# Patient Record
Sex: Female | Born: 1965 | Race: White | Hispanic: No | Smoking: Current every day smoker
Health system: Southern US, Community
[De-identification: ages and names within clinical notes are randomized; demographics above are authoritative.]

## PROBLEM LIST (undated history)

## (undated) DIAGNOSIS — D649 Anemia, unspecified: Secondary | ICD-10-CM

## (undated) DIAGNOSIS — R112 Nausea with vomiting, unspecified: Secondary | ICD-10-CM

## (undated) DIAGNOSIS — R51 Headache: Secondary | ICD-10-CM

## (undated) DIAGNOSIS — R519 Headache, unspecified: Secondary | ICD-10-CM

## (undated) DIAGNOSIS — Z800 Family history of malignant neoplasm of digestive organs: Secondary | ICD-10-CM

## (undated) DIAGNOSIS — F32A Depression, unspecified: Secondary | ICD-10-CM

## (undated) DIAGNOSIS — F419 Anxiety disorder, unspecified: Secondary | ICD-10-CM

## (undated) DIAGNOSIS — F329 Major depressive disorder, single episode, unspecified: Secondary | ICD-10-CM

## (undated) DIAGNOSIS — Z9889 Other specified postprocedural states: Secondary | ICD-10-CM

## (undated) DIAGNOSIS — R87810 Cervical high risk human papillomavirus (HPV) DNA test positive: Secondary | ICD-10-CM

## (undated) HISTORY — DX: Cervical high risk human papillomavirus (HPV) DNA test positive: R87.810

## (undated) HISTORY — PX: COLONOSCOPY: SHX174

## (undated) HISTORY — PX: JOINT REPLACEMENT: SHX530

## (undated) HISTORY — DX: Family history of malignant neoplasm of digestive organs: Z80.0

---

## 2001-03-14 HISTORY — PX: ABDOMINAL HYSTERECTOMY: SHX81

## 2009-03-14 HISTORY — PX: CHOLECYSTECTOMY: SHX55

## 2012-09-28 ENCOUNTER — Ambulatory Visit: Payer: Self-pay | Admitting: Specialist

## 2012-09-28 DIAGNOSIS — Z01810 Encounter for preprocedural cardiovascular examination: Secondary | ICD-10-CM

## 2012-09-28 LAB — COMPREHENSIVE METABOLIC PANEL
Albumin: 3.1 g/dL — ABNORMAL LOW (ref 3.4–5.0)
Alkaline Phosphatase: 79 U/L (ref 50–136)
Anion Gap: 5 — ABNORMAL LOW (ref 7–16)
BUN: 8 mg/dL (ref 7–18)
Co2: 26 mmol/L (ref 21–32)
EGFR (Non-African Amer.): >60
Glucose: 107 mg/dL — ABNORMAL HIGH (ref 65–99)
Potassium: 3.7 mmol/L (ref 3.5–5.1)
SGOT(AST): 24 U/L (ref 15–37)
SGPT (ALT): 35 U/L (ref 12–78)
Sodium: 138 mmol/L (ref 136–145)
Total Protein: 6.8 g/dL (ref 6.4–8.2)

## 2012-09-28 LAB — CBC WITH DIFFERENTIAL/PLATELET
Basophil #: 0.1 10*3/uL (ref 0.0–0.1)
Basophil %: 0.5 %
Eosinophil %: 2.6 %
HGB: 14.3 g/dL (ref 12.0–16.0)
Lymphocyte #: 3 10*3/uL (ref 1.0–3.6)
Lymphocyte %: 30.3 %
MCH: 27.4 pg (ref 26.0–34.0)
MCHC: 33.5 g/dL (ref 32.0–36.0)
MCV: 82 fL (ref 80–100)
Monocyte #: 0.6 x10 3/mm (ref 0.2–0.9)
Monocyte %: 5.8 %
Neutrophil %: 60.8 %
Platelet: 244 10*3/uL (ref 150–440)
RBC: 5.2 10*6/uL (ref 3.80–5.20)
WBC: 9.9 10*3/uL (ref 3.6–11.0)

## 2012-09-28 LAB — AMYLASE: Amylase: 37 U/L (ref 25–115)

## 2012-09-28 LAB — FOLATE: Folic Acid: 8.3 ng/mL (ref 3.1–100.0)

## 2012-09-28 LAB — PHOSPHORUS: Phosphorus: 2.9 mg/dL (ref 2.5–4.9)

## 2012-09-28 LAB — FERRITIN: Ferritin (ARMC): 66 ng/mL (ref 8–388)

## 2012-09-28 LAB — APTT: Activated PTT: 30 secs (ref 23.6–35.9)

## 2012-09-28 LAB — MAGNESIUM: Magnesium: 1.8 mg/dL

## 2012-09-28 LAB — PROTIME-INR: Prothrombin Time: 12.7 secs (ref 11.5–14.7)

## 2012-09-28 LAB — IRON AND TIBC
Iron Bind.Cap.(Total): 332 ug/dL (ref 250–450)
Iron: 67 ug/dL (ref 50–170)
Unbound Iron-Bind.Cap.: 265 ug/dL

## 2012-10-02 ENCOUNTER — Ambulatory Visit: Payer: Self-pay | Admitting: Specialist

## 2012-10-12 ENCOUNTER — Ambulatory Visit: Payer: Self-pay | Admitting: Specialist

## 2014-03-14 HISTORY — PX: COLONOSCOPY: SHX174

## 2014-12-11 ENCOUNTER — Encounter
Admission: RE | Admit: 2014-12-11 | Discharge: 2014-12-11 | Disposition: A | Discharge status: Home / Self Care | Payer: BC Managed Care – PPO | Source: Ambulatory Visit | Attending: Orthopedic Surgery | Admitting: Orthopedic Surgery

## 2014-12-11 ENCOUNTER — Other Ambulatory Visit: Payer: BC Managed Care – PPO

## 2014-12-11 DIAGNOSIS — Z01818 Encounter for other preprocedural examination: Secondary | ICD-10-CM | POA: Insufficient documentation

## 2014-12-11 DIAGNOSIS — M1612 Unilateral primary osteoarthritis, left hip: Secondary | ICD-10-CM | POA: Diagnosis not present

## 2014-12-11 HISTORY — DX: Headache: R51

## 2014-12-11 HISTORY — DX: Anemia, unspecified: D64.9

## 2014-12-11 HISTORY — DX: Depression, unspecified: F32.A

## 2014-12-11 HISTORY — DX: Major depressive disorder, single episode, unspecified: F32.9

## 2014-12-11 HISTORY — DX: Headache, unspecified: R51.9

## 2014-12-11 HISTORY — DX: Anxiety disorder, unspecified: F41.9

## 2014-12-11 LAB — URINALYSIS COMPLETE WITH MICROSCOPIC (ARMC ONLY)
BILIRUBIN URINE: NEGATIVE
Glucose, UA: NEGATIVE mg/dL
KETONES UR: NEGATIVE mg/dL
Nitrite: NEGATIVE
PH: 5 (ref 5.0–8.0)
Protein, ur: 30 mg/dL — AB
SPECIFIC GRAVITY, URINE: 1.027 (ref 1.005–1.030)

## 2014-12-11 LAB — BASIC METABOLIC PANEL
ANION GAP: 5 (ref 5–15)
BUN: 14 mg/dL (ref 6–20)
CALCIUM: 8.5 mg/dL — AB (ref 8.9–10.3)
CHLORIDE: 105 mmol/L (ref 101–111)
CO2: 24 mmol/L (ref 22–32)
Creatinine, Ser: 0.84 mg/dL (ref 0.44–1.00)
GFR calc non Af Amer: >60 mL/min (ref >60)
Glucose, Bld: 91 mg/dL (ref 65–99)
Potassium: 4 mmol/L (ref 3.5–5.1)
Sodium: 134 mmol/L — ABNORMAL LOW (ref 135–145)

## 2014-12-11 LAB — CBC
HCT: 42 % (ref 35.0–47.0)
HEMOGLOBIN: 13.7 g/dL (ref 12.0–16.0)
MCH: 27.6 pg (ref 26.0–34.0)
MCHC: 32.7 g/dL (ref 32.0–36.0)
MCV: 84.4 fL (ref 80.0–100.0)
Platelets: 268 10*3/uL (ref 150–440)
RBC: 4.98 MIL/uL (ref 3.80–5.20)
RDW: 14.2 % (ref 11.5–14.5)
WBC: 9.9 10*3/uL (ref 3.6–11.0)

## 2014-12-11 LAB — SURGICAL PCR SCREEN
MRSA, PCR: NEGATIVE
STAPHYLOCOCCUS AUREUS: NEGATIVE

## 2014-12-11 LAB — SEDIMENTATION RATE: Sed Rate: 20 mm/hr (ref 0–20)

## 2014-12-11 LAB — PROTIME-INR
INR: 1
Prothrombin Time: 13.4 seconds (ref 11.4–15.0)

## 2014-12-11 LAB — TYPE AND SCREEN
ABO/RH(D): O POS
Antibody Screen: NEGATIVE

## 2014-12-11 LAB — APTT: APTT: 32 s (ref 24–36)

## 2014-12-11 LAB — ABO/RH: ABO/RH(D): O POS

## 2014-12-11 NOTE — Patient Instructions (Signed)
  Your procedure is scheduled on: December 18, 2014 (Thursday) Report to Day Surgery. To find out your arrival time please call [phone] between 1PM - 3PM on (December 17, 2014 Wednesday).  Remember: Instructions that are not followed completely may result in serious medical risk, up to and including death, or upon the discretion of your surgeon and anesthesiologist your surgery may need to be rescheduled.    __x__ 1. Do not eat food or drink liquids after midnight. No gum chewing or hard candies.     __x__ 2. No Alcohol for 24 hours before or after surgery.   ____ 3. Bring all medications with you on the day of surgery if instructed.    __x 4. Notify your doctor if there is any change in your medical condition     (cold, fever, infections).     Do not wear jewelry, make-up, hairpins, clips or nail polish.  Do not wear lotions, powders, or perfumes. You may wear deodorant.  Do not shave 48 hours prior to surgery. Men may shave face and neck.  Do not bring valuables to the hospital.    [facility] is not responsible for any belongings or valuables.               Contacts, dentures or bridgework may not be worn into surgery.  Leave your suitcase in the car. After surgery it may be brought to your room.  For patients admitted to the hospital, discharge time is determined by your                treatment team.   Patients discharged the day of surgery will not be allowed to drive home.   Please read over the following fact sheets that you were given:   MRSA Information and Surgical Site Infection Prevention   ____ Take these medicines the morning of surgery with A SIP OF WATER:    1.   2.   3.   4.  5.  6.  ____ Fleet Enema (as directed)   __x__ Use CHG Soap as directed  ____ Use inhalers on the day of surgery  ____ Stop metformin 2 days prior to surgery    ____ Take 1/2 of usual insulin dose the night before surgery and none on the morning of surgery.   ____ Stop  Coumadin/Plavix/aspirin on   __x__ Stop Anti-inflammatories on (STOP IBUPROFEN NOW) TYLENOL OK TO TAKE FOR PAIN)   ____ Stop supplements until after surgery.    ____ Bring C-Pap to the hospital.

## 2014-12-12 NOTE — OR Nursing (Signed)
UA faxed to Dr Rosita Kea office

## 2014-12-17 ENCOUNTER — Other Ambulatory Visit: Payer: BC Managed Care – PPO

## 2014-12-18 ENCOUNTER — Encounter
Admission: RE | Disposition: A | Discharge status: Home Health | Payer: Self-pay | Source: Ambulatory Visit | Attending: Orthopedic Surgery

## 2014-12-18 ENCOUNTER — Inpatient Hospital Stay: Payer: BC Managed Care – PPO | Admitting: Anesthesiology

## 2014-12-18 ENCOUNTER — Inpatient Hospital Stay
Admission: RE | Admit: 2014-12-18 | Discharge: 2014-12-21 | DRG: 470 | Disposition: A | Discharge status: Home Health | Payer: BC Managed Care – PPO | Source: Ambulatory Visit | Attending: Orthopedic Surgery | Admitting: Orthopedic Surgery

## 2014-12-18 ENCOUNTER — Inpatient Hospital Stay: Payer: BC Managed Care – PPO

## 2014-12-18 ENCOUNTER — Encounter: Payer: Self-pay | Admitting: *Deleted

## 2014-12-18 DIAGNOSIS — E119 Type 2 diabetes mellitus without complications: Secondary | ICD-10-CM | POA: Diagnosis present

## 2014-12-18 DIAGNOSIS — F329 Major depressive disorder, single episode, unspecified: Secondary | ICD-10-CM | POA: Diagnosis present

## 2014-12-18 DIAGNOSIS — Z6841 Body Mass Index (BMI) 40.0 and over, adult: Secondary | ICD-10-CM | POA: Diagnosis not present

## 2014-12-18 DIAGNOSIS — G8918 Other acute postprocedural pain: Secondary | ICD-10-CM

## 2014-12-18 DIAGNOSIS — F419 Anxiety disorder, unspecified: Secondary | ICD-10-CM | POA: Diagnosis present

## 2014-12-18 DIAGNOSIS — M1612 Unilateral primary osteoarthritis, left hip: Principal | ICD-10-CM | POA: Diagnosis present

## 2014-12-18 DIAGNOSIS — Z419 Encounter for procedure for purposes other than remedying health state, unspecified: Secondary | ICD-10-CM

## 2014-12-18 DIAGNOSIS — F17210 Nicotine dependence, cigarettes, uncomplicated: Secondary | ICD-10-CM | POA: Diagnosis present

## 2014-12-18 HISTORY — PX: TOTAL HIP ARTHROPLASTY: SHX124

## 2014-12-18 LAB — CBC
HEMATOCRIT: 40.4 % (ref 35.0–47.0)
Hemoglobin: 13.3 g/dL (ref 12.0–16.0)
MCH: 27.8 pg (ref 26.0–34.0)
MCHC: 32.9 g/dL (ref 32.0–36.0)
MCV: 84.7 fL (ref 80.0–100.0)
PLATELETS: 208 10*3/uL (ref 150–440)
RBC: 4.77 MIL/uL (ref 3.80–5.20)
RDW: 14.3 % (ref 11.5–14.5)
WBC: 19.9 10*3/uL — ABNORMAL HIGH (ref 3.6–11.0)

## 2014-12-18 LAB — CREATININE, SERUM
CREATININE: 0.68 mg/dL (ref 0.44–1.00)
GFR calc Af Amer: >60 mL/min (ref >60)

## 2014-12-18 SURGERY — ARTHROPLASTY, HIP, TOTAL, ANTERIOR APPROACH
Anesthesia: Monitor Anesthesia Care | Site: Hip | Laterality: Left | Wound class: Clean

## 2014-12-18 MED ORDER — METOCLOPRAMIDE HCL 10 MG PO TABS
5.0000 mg | ORAL_TABLET | Freq: Three times a day (TID) | ORAL | Status: DC | PRN
Start: 2014-12-18 — End: 2014-12-21

## 2014-12-18 MED ORDER — LIDOCAINE HCL 2 % EX GEL
CUTANEOUS | Status: DC | PRN
  Administered 2014-12-18: 1 via TOPICAL

## 2014-12-18 MED ORDER — DOCUSATE SODIUM 100 MG PO CAPS
100.0000 mg | ORAL_CAPSULE | Freq: Two times a day (BID) | ORAL | Status: DC
  Administered 2014-12-18 – 2014-12-21 (×6): 100 mg via ORAL
  Filled 2014-12-18 (×6): qty 1

## 2014-12-18 MED ORDER — FAMOTIDINE 20 MG PO TABS
ORAL_TABLET | ORAL | Status: AC
  Administered 2014-12-18: 20 mg via ORAL
  Filled 2014-12-18: qty 1

## 2014-12-18 MED ORDER — FENTANYL CITRATE (PF) 100 MCG/2ML IJ SOLN
INTRAMUSCULAR | Status: DC | PRN
  Administered 2014-12-18 (×2): 50 ug via INTRAVENOUS

## 2014-12-18 MED ORDER — OXYCODONE HCL 5 MG/5ML PO SOLN: 5.0000 mg | Freq: Once | ORAL | Status: DC | PRN

## 2014-12-18 MED ORDER — MIDAZOLAM HCL 5 MG/5ML IJ SOLN
INTRAMUSCULAR | Status: DC | PRN
  Administered 2014-12-18 (×4): 1 mg via INTRAVENOUS

## 2014-12-18 MED ORDER — METHOCARBAMOL 500 MG PO TABS
500.0000 mg | ORAL_TABLET | Freq: Four times a day (QID) | ORAL | Status: DC | PRN
  Administered 2014-12-18 – 2014-12-21 (×11): 500 mg via ORAL
  Filled 2014-12-18 (×11): qty 1

## 2014-12-18 MED ORDER — ENOXAPARIN SODIUM 40 MG/0.4ML ~~LOC~~ SOLN: 40.0000 mg | SUBCUTANEOUS | Status: DC

## 2014-12-18 MED ORDER — GENTAMICIN SULFATE 40 MG/ML IJ SOLN
INTRAMUSCULAR | Status: DC | PRN
  Administered 2014-12-18: 80 mg

## 2014-12-18 MED ORDER — ACETAMINOPHEN 325 MG PO TABS: 650.0000 mg | ORAL_TABLET | Freq: Four times a day (QID) | ORAL | Status: DC | PRN

## 2014-12-18 MED ORDER — ONDANSETRON HCL 4 MG PO TABS: 4.0000 mg | ORAL_TABLET | Freq: Four times a day (QID) | ORAL | Status: DC | PRN

## 2014-12-18 MED ORDER — OXYCODONE HCL 5 MG PO TABS: 5.0000 mg | ORAL_TABLET | Freq: Once | ORAL | Status: DC | PRN

## 2014-12-18 MED ORDER — ALUM & MAG HYDROXIDE-SIMETH 200-200-20 MG/5ML PO SUSP: 30.0000 mL | ORAL | Status: DC | PRN

## 2014-12-18 MED ORDER — MORPHINE SULFATE (PF) 2 MG/ML IV SOLN
2.0000 mg | INTRAVENOUS | Status: DC | PRN
  Administered 2014-12-18 (×2): 2 mg via INTRAVENOUS
  Filled 2014-12-18 (×2): qty 1

## 2014-12-18 MED ORDER — DEXTROSE 5 % IV SOLN: 500.0000 mg | Freq: Four times a day (QID) | INTRAVENOUS | Status: DC | PRN

## 2014-12-18 MED ORDER — PROPOFOL 500 MG/50ML IV EMUL
INTRAVENOUS | Status: DC | PRN
  Administered 2014-12-18: 80 ug/kg/min via INTRAVENOUS

## 2014-12-18 MED ORDER — SERTRALINE HCL 50 MG PO TABS
50.0000 mg | ORAL_TABLET | Freq: Every day | ORAL | Status: DC
  Administered 2014-12-19 – 2014-12-21 (×3): 50 mg via ORAL
  Filled 2014-12-18 (×4): qty 1

## 2014-12-18 MED ORDER — MAGNESIUM CITRATE PO SOLN: 1.0000 | Freq: Once | ORAL | Status: DC | PRN

## 2014-12-18 MED ORDER — MENTHOL 3 MG MT LOZG: 1.0000 | LOZENGE | OROMUCOSAL | Status: DC | PRN

## 2014-12-18 MED ORDER — LACTATED RINGERS IV SOLN
INTRAVENOUS | Status: DC
  Administered 2014-12-18 (×2): via INTRAVENOUS

## 2014-12-18 MED ORDER — TRANEXAMIC ACID 1000 MG/10ML IV SOLN
1000.0000 mg | INTRAVENOUS | Status: DC | PRN
  Administered 2014-12-18: 1000 mg via INTRAVENOUS

## 2014-12-18 MED ORDER — TRANEXAMIC ACID 1000 MG/10ML IV SOLN
1000.0000 mg | INTRAVENOUS | Status: DC
  Filled 2014-12-18: qty 10

## 2014-12-18 MED ORDER — PROGESTERONE MICRONIZED 200 MG PO CAPS
200.0000 mg | ORAL_CAPSULE | Freq: Every day | ORAL | Status: DC
  Administered 2014-12-19 – 2014-12-21 (×3): 200 mg via ORAL
  Filled 2014-12-18 (×4): qty 1

## 2014-12-18 MED ORDER — SODIUM CHLORIDE 0.9 % IV SOLN
10000.0000 ug | INTRAVENOUS | Status: DC | PRN
  Administered 2014-12-18 (×4): .1 ug via INTRAVENOUS

## 2014-12-18 MED ORDER — SODIUM CHLORIDE 0.9 % IV SOLN
INTRAVENOUS | Status: DC
  Administered 2014-12-18 – 2014-12-19 (×2): via INTRAVENOUS

## 2014-12-18 MED ORDER — DEXTROSE 5 % IV SOLN
3.0000 g | Freq: Four times a day (QID) | INTRAVENOUS | Status: AC
  Administered 2014-12-18 – 2014-12-19 (×3): 3 g via INTRAVENOUS
  Filled 2014-12-18 (×3): qty 3000

## 2014-12-18 MED ORDER — ACETAMINOPHEN 650 MG RE SUPP: 650.0000 mg | Freq: Four times a day (QID) | RECTAL | Status: DC | PRN

## 2014-12-18 MED ORDER — ZOLPIDEM TARTRATE 5 MG PO TABS: 5.0000 mg | ORAL_TABLET | Freq: Every evening | ORAL | Status: DC | PRN

## 2014-12-18 MED ORDER — METOCLOPRAMIDE HCL 5 MG/ML IJ SOLN: 5.0000 mg | Freq: Three times a day (TID) | INTRAMUSCULAR | Status: DC | PRN

## 2014-12-18 MED ORDER — BISACODYL 10 MG RE SUPP
10.0000 mg | Freq: Every day | RECTAL | Status: DC | PRN
  Administered 2014-12-20: 10 mg via RECTAL
  Filled 2014-12-18: qty 1

## 2014-12-18 MED ORDER — PHENOL 1.4 % MT LIQD: 1.0000 | OROMUCOSAL | Status: DC | PRN

## 2014-12-18 MED ORDER — OXYCODONE HCL 5 MG PO TABS
5.0000 mg | ORAL_TABLET | ORAL | Status: DC | PRN
  Administered 2014-12-18: 5 mg via ORAL
  Administered 2014-12-18: 10 mg via ORAL
  Administered 2014-12-18: 5 mg via ORAL
  Administered 2014-12-18: 10 mg via ORAL
  Administered 2014-12-19: 5 mg via ORAL
  Administered 2014-12-19 (×4): 10 mg via ORAL
  Administered 2014-12-19 – 2014-12-20 (×3): 5 mg via ORAL
  Administered 2014-12-20 (×2): 10 mg via ORAL
  Administered 2014-12-20 – 2014-12-21 (×2): 5 mg via ORAL
  Administered 2014-12-21: 10 mg via ORAL
  Administered 2014-12-21: 5 mg via ORAL
  Filled 2014-12-18 (×4): qty 2
  Filled 2014-12-18 (×2): qty 1
  Filled 2014-12-18: qty 2
  Filled 2014-12-18 (×2): qty 1
  Filled 2014-12-18 (×3): qty 2
  Filled 2014-12-18 (×2): qty 1
  Filled 2014-12-18: qty 2
  Filled 2014-12-18: qty 1
  Filled 2014-12-18: qty 2
  Filled 2014-12-18: qty 1

## 2014-12-18 MED ORDER — BUPIVACAINE-EPINEPHRINE 0.25% -1:200000 IJ SOLN
INTRAMUSCULAR | Status: DC | PRN
  Administered 2014-12-18: 30 mL

## 2014-12-18 MED ORDER — GENTAMICIN SULFATE 40 MG/ML IJ SOLN
INTRAMUSCULAR | Status: AC
  Filled 2014-12-18: qty 2

## 2014-12-18 MED ORDER — BUPIVACAINE-EPINEPHRINE (PF) 0.25% -1:200000 IJ SOLN
INTRAMUSCULAR | Status: AC
  Filled 2014-12-18: qty 30

## 2014-12-18 MED ORDER — ENOXAPARIN SODIUM 40 MG/0.4ML ~~LOC~~ SOLN
40.0000 mg | Freq: Two times a day (BID) | SUBCUTANEOUS | Status: DC
  Administered 2014-12-19 – 2014-12-20 (×4): 40 mg via SUBCUTANEOUS
  Filled 2014-12-18 (×6): qty 0.4

## 2014-12-18 MED ORDER — ONDANSETRON HCL 4 MG/2ML IJ SOLN: 4.0000 mg | Freq: Four times a day (QID) | INTRAMUSCULAR | Status: DC | PRN

## 2014-12-18 MED ORDER — CEFAZOLIN SODIUM 10 G IJ SOLR
3.0000 g | Freq: Once | INTRAMUSCULAR | Status: AC
  Administered 2014-12-18: 3 g via INTRAVENOUS
  Filled 2014-12-18: qty 3000

## 2014-12-18 MED ORDER — BUPIVACAINE-EPINEPHRINE (PF) 0.5% -1:200000 IJ SOLN
INTRAMUSCULAR | Status: DC | PRN
  Administered 2014-12-18: 2.5 mL

## 2014-12-18 MED ORDER — FENTANYL CITRATE (PF) 100 MCG/2ML IJ SOLN: 25.0000 ug | INTRAMUSCULAR | Status: DC | PRN

## 2014-12-18 MED ORDER — MAGNESIUM HYDROXIDE 400 MG/5ML PO SUSP
30.0000 mL | Freq: Every day | ORAL | Status: DC | PRN
  Administered 2014-12-19: 30 mL via ORAL
  Filled 2014-12-18: qty 30

## 2014-12-18 MED ORDER — HEPARIN SODIUM (PORCINE) 10000 UNIT/ML IJ SOLN
INTRAMUSCULAR | Status: AC
  Filled 2014-12-18: qty 1

## 2014-12-18 MED ORDER — FAMOTIDINE 20 MG PO TABS
20.0000 mg | ORAL_TABLET | Freq: Once | ORAL | Status: AC
  Administered 2014-12-18: 20 mg via ORAL

## 2014-12-18 MED ORDER — NEOMYCIN-POLYMYXIN B GU 40-200000 IR SOLN
Status: AC
  Filled 2014-12-18: qty 4

## 2014-12-18 MED ORDER — LIDOCAINE HCL (CARDIAC) 20 MG/ML IV SOLN
INTRAVENOUS | Status: DC | PRN
  Administered 2014-12-18: 30 mg via INTRAVENOUS

## 2014-12-18 SURGICAL SUPPLY — 59 items
BAGS BLOOD CELL SAVER (MISCELLANEOUS) IMPLANT
BLADE SAW 1/2 (BLADE) ×3 IMPLANT
BNDG COHESIVE 6X5 TAN STRL LF (GAUZE/BANDAGES/DRESSINGS) ×6 IMPLANT
CANISTER SUCT 1200ML W/VALVE (MISCELLANEOUS) ×3 IMPLANT
CAPT HIP TOTAL 3 ×3 IMPLANT
CATH FOL LEG HOLDER (MISCELLANEOUS) ×3 IMPLANT
CATH TRAY METER 16FR LF (MISCELLANEOUS) ×3 IMPLANT
CELL SAVER AAL HAEMONETICS (MISCELLANEOUS)
CELL SAVER AUTOCOAGULATE (MISCELLANEOUS) ×3
CELL SAVER BLD BAGS (MISCELLANEOUS)
CELL SAVER CARDIOTOMY HAEMONET (MISCELLANEOUS)
CELL SAVER COLL SVCS (MISCELLANEOUS) ×3
CELL SAVER FILTER LIPID PALL S (MISCELLANEOUS) ×3
CELL SAVER FILTER REG 2-05 PAL (MISCELLANEOUS)
CELL WASH SET (MISCELLANEOUS) IMPLANT
CHLORAPREP W/TINT 26ML (MISCELLANEOUS) ×3 IMPLANT
DRAPE C-ARM XRAY 36X54 (DRAPES) ×3 IMPLANT
DRAPE INCISE IOBAN 66X60 STRL (DRAPES) IMPLANT
DRAPE POUCH INSTRU U-SHP 10X18 (DRAPES) ×3 IMPLANT
DRAPE SHEET LG 3/4 BI-LAMINATE (DRAPES) ×9 IMPLANT
DRAPE TABLE BACK 80X90 (DRAPES) ×3 IMPLANT
ELECT BLADE 6.5 EXT (BLADE) ×3 IMPLANT
FILTER LIPID PALL S CELL SAVER (MISCELLANEOUS) ×1 IMPLANT
FILTER REG 2-05 PAL CELL SAVER (MISCELLANEOUS) IMPLANT
GAUZE SPONGE 4X4 12PLY STRL (GAUZE/BANDAGES/DRESSINGS) ×3 IMPLANT
GLOVE BIOGEL PI IND STRL 9 (GLOVE) ×1 IMPLANT
GLOVE BIOGEL PI INDICATOR 9 (GLOVE) ×2
GLOVE SURG ORTHO 9.0 STRL STRW (GLOVE) ×3 IMPLANT
GOWN SPECIALTY ULTRA XL (MISCELLANEOUS) ×3 IMPLANT
GOWN STRL REUS W/ TWL LRG LVL3 (GOWN DISPOSABLE) ×1 IMPLANT
GOWN STRL REUS W/TWL LRG LVL3 (GOWN DISPOSABLE) ×2
HEMOVAC 400CC 10FR (MISCELLANEOUS) ×3 IMPLANT
HOOD PEEL AWAY FACE SHEILD DIS (HOOD) ×3 IMPLANT
KIT PREVENA INCISION MGT20CM45 (CANNISTER) ×3 IMPLANT
MAT BLUE FLOOR 46X72 FLO (MISCELLANEOUS) ×3 IMPLANT
NDL SAFETY 18GX1.5 (NEEDLE) ×3 IMPLANT
NEEDLE SPNL 18GX3.5 QUINCKE PK (NEEDLE) ×3 IMPLANT
NS IRRIG 1000ML POUR BTL (IV SOLUTION) ×3 IMPLANT
PACK HIP COMPR (MISCELLANEOUS) ×3 IMPLANT
SAVE CELL AUTOCOAG (MISCELLANEOUS) ×1 IMPLANT
SAVER CELL CARDITMY HAEMONET (MISCELLANEOUS) IMPLANT
SAVER CELL COLL SVCS (MISCELLANEOUS) ×1 IMPLANT
SAVER CELL HAEMONETICS (MISCELLANEOUS) IMPLANT
SOL PREP PVP 2OZ (MISCELLANEOUS) ×3
SOLUTION PREP PVP 2OZ (MISCELLANEOUS) ×1 IMPLANT
STAPLER SKIN PROX 35W (STAPLE) ×3 IMPLANT
STRAP SAFETY BODY (MISCELLANEOUS) ×6 IMPLANT
SUT DVC 2 QUILL PDO  T11 36X36 (SUTURE) ×2
SUT DVC 2 QUILL PDO T11 36X36 (SUTURE) ×1 IMPLANT
SUT DVC QUILL MONODERM 30X30 (SUTURE) ×3 IMPLANT
SUT ETHIBOND NAB CT1 #1 30IN (SUTURE) ×3 IMPLANT
SUT SILK 0 (SUTURE) ×2
SUT SILK 0 30XBRD TIE 6 (SUTURE) ×1 IMPLANT
SUT VIC AB 1 CT1 36 (SUTURE) ×3 IMPLANT
SYR 20CC LL (SYRINGE) ×3 IMPLANT
SYR 30ML LL (SYRINGE) ×3 IMPLANT
TAPE MICROFOAM 4IN (TAPE) ×3 IMPLANT
TUBE KAMVAC SUCTION (TUBING) ×3 IMPLANT
WATER STERILE IRR 1000ML POUR (IV SOLUTION) ×3 IMPLANT

## 2014-12-18 NOTE — Evaluation (Signed)
Physical Therapy Evaluation Patient Details Name: Kimberly Nixon MRN: [id number] DOB: 03/13/1966 Today's Date: 12/18/2014   History of Present Illness  Pt underwent L THA without reported post-op complications. Evaluation performed on POD#0. Pt reports full independence with ADLs/IADLs prior to admission. Pt was driving and ambulating limited community distances without assistive device. No reported falls in the last 12 months  Clinical Impression  Pt demonstrates excellent bed mobility, transfers, and ambulation on this date. Upon arrival pt is crying silently due to pain. RN requested pain meds who agrees to provide when next available. Pt is able to transfer from bed to recliner with only CGA provided by therapist. She follows commands appropriately and demonstrates safe technique. Pt will be safe to return home with [facility] PT at discharge. Largest barrier will be practicing stairs as her bedroom/bathroom are on second floor and she will have 12 stairs to ascend/descend. Pt will benefit from skilled PT services to address deficits in strength, balance, and mobility in order to return to full function at home.     Follow Up Recommendations Home health PT;Supervision - Intermittent    Equipment Recommendations  Rolling walker with 5" wheels    Recommendations for Other Services       Precautions / Restrictions Precautions Precautions: Anterior Hip Precaution Booklet Issued: Yes (comment) Restrictions Weight Bearing Restrictions: Yes LLE Weight Bearing: Weight bearing as tolerated      Mobility  Bed Mobility Overal bed mobility: Needs Assistance Bed Mobility: Supine to Sit     Supine to sit: Supervision     General bed mobility comments: Good speed and sequencing. Pt performs with minimal complaints of pain and no assistance from therapist  Transfers Overall transfer level: Needs assistance Equipment used: Rolling walker (2 wheeled) Transfers: Sit to/from Stand Sit to Stand:  Min guard         General transfer comment: Good weight acceptance to LLE. Good speed and sequencing. No reported increase in pain in standing. Cues for hand placement. Pt provided cues and instruction for standing transfer to recliner  Ambulation/Gait Ambulation/Gait assistance: Min guard Ambulation Distance (Feet): 3 Feet Assistive device: Rolling walker (2 wheeled)     Gait velocity interpretation: <1.8 ft/sec, indicative of risk for recurrent falls General Gait Details: Pt able to take small steps from bed to recliner. Education provided regarding proper sequencing with walker. Pt reports decreased pain in L hip following transfers and limited ambulation  Stairs            Wheelchair Mobility    Modified Rankin (Stroke Patients Only)       Balance Overall balance assessment: Needs assistance   Sitting balance-Leahy Scale: Good       Standing balance-Leahy Scale: Fair                               Pertinent Vitals/Pain Pain Assessment: 0-10 Pain Score: 10-Worst pain ever Pain Location: L hip Pain Intervention(s): Monitored during session;Premedicated before session;Repositioned;Patient requesting pain meds-RN notified    Home Living Family/patient expects to be discharged to:: Private residence Living Arrangements: Children;Other relatives (Nephew) Available Help at Discharge: Family Type of Home: House Home Access: Stairs to enter Entrance Stairs-Rails: None Entrance Stairs-Number of Steps: one Home Layout: Multi-level;Bed/bath upstairs Home Equipment: None      Prior Function Level of Independence: Independent         Comments: Limited community ambulation without assistive device     Hand  Dominance   Dominant Hand: Right    Extremity/Trunk Assessment   Upper Extremity Assessment: Overall WFL for tasks assessed           Lower Extremity Assessment: LLE deficits/detail   LLE Deficits / Details: Pt demonstrates full  dorsiflexion/plantarflexion bilaterally. Able to perform L hip flexion and abduction during bed mobility     Communication   Communication: No difficulties  Cognition Arousal/Alertness: Awake/alert Behavior During Therapy: WFL for tasks assessed/performed Overall Cognitive Status: Within Functional Limits for tasks assessed                      General Comments      Exercises General Exercises - Lower Extremity Ankle Circles/Pumps: Strengthening;Both;10 reps;Supine (attempted further exercises but deferred due to pain)      Assessment/Plan    PT Assessment Patient needs continued PT services  PT Diagnosis Difficulty walking;Abnormality of gait;Generalized weakness   PT Problem List Decreased strength;Decreased range of motion;Decreased activity tolerance;Decreased balance;Decreased mobility;Decreased knowledge of precautions;Pain  PT Treatment Interventions DME instruction;Gait training;Stair training;Functional mobility training;Therapeutic activities;Therapeutic exercise;Balance training;Neuromuscular re-education;Patient/family education   PT Goals (Current goals can be found in the Care Plan section) Acute Rehab PT Goals Patient Stated Goal: "I really want the pain to go away" PT Goal Formulation: With patient Time For Goal Achievement: 01/01/15 Potential to Achieve Goals: Good    Frequency BID   Barriers to discharge Inaccessible home environment bedroom/bath on second floor, 12 steps    Co-evaluation               End of Session Equipment Utilized During Treatment: Gait belt Activity Tolerance: Patient limited by pain Patient left: in chair;with call bell/phone within reach;with chair alarm set Nurse Communication: Other (comment) (Need for pain meds)         Time: 1520-1600 PT Time Calculation (min) (ACUTE ONLY): 40 min   Charges:   PT Evaluation $Initial PT Evaluation Tier I: 1 Procedure PT Treatments $Therapeutic Activity: 8-22 mins    PT G Codes:       Sharalyn Ink Huprich PT, DPT   Huprich,Jason 12/18/2014, 4:16 PM

## 2014-12-18 NOTE — Transfer of Care (Signed)
Immediate Anesthesia Transfer of Care Note  Patient: [name]  Procedure(s) Performed: Procedure(s): TOTAL HIP ARTHROPLASTY ANTERIOR APPROACH (Left)  Patient Location:    Anesthesia Type:Spinal  Level of Consciousness: awake  Airway & Oxygen Therapy: Patient Spontanous Breathing and Patient connected to face mask oxygen  Post-op Assessment: Report given to RN  Post vital signs: Reviewed and stable  Last Vitals:  Filed Vitals:   12/18/14 0939  BP: 104/76  Pulse: 90  Temp: 37.4 C  Resp: 23    Complications: No apparent anesthesia complications

## 2014-12-18 NOTE — Anesthesia Procedure Notes (Signed)
Spinal Patient location during procedure: OR Start time: 12/18/2014 7:16 AM End time: 12/18/2014 7:18 AM Staffing Anesthesiologist: Katy Fitch K Performed by: anesthesiologist  Preanesthetic Checklist Completed: patient identified, site marked, surgical consent, pre-op evaluation, timeout performed, IV checked, risks and benefits discussed and monitors and equipment checked Spinal Block Patient position: sitting Prep: Betadine Patient monitoring: heart rate, continuous pulse ox, blood pressure and cardiac monitor Approach: midline Location: L4-5 Injection technique: single-shot Needle Needle type: Whitacre and Introducer  Needle gauge: 24 G Needle length: 9 cm Assessment Sensory level: T10 Additional Notes Negative paresthesia. Negative blood return. Positive free-flowing CSF. Expiration date of kit checked and confirmed. Patient tolerated procedure well, without complications.

## 2014-12-18 NOTE — Plan of Care (Signed)
Problem: Consults Goal: Diagnosis- Total Joint Replacement Outcome: Progressing Primary Total Hip     

## 2014-12-18 NOTE — Anesthesia Preprocedure Evaluation (Signed)
Anesthesia Evaluation  Patient identified by MRN, date of birth, ID band Patient awake    Reviewed: Allergy & Precautions, H&P , NPO status , Patient's Chart, lab work & pertinent test results  History of Anesthesia Complications Negative for: history of anesthetic complications  Airway Mallampati: III  TM Distance: >3 FB Neck ROM: full    Dental no notable dental hx. (+) Teeth Intact   Pulmonary neg shortness of breath, sleep apnea , Current Smoker,    Pulmonary exam normal breath sounds clear to auscultation       Cardiovascular Exercise Tolerance: Good (-) Past MI Normal cardiovascular exam Rhythm:regular Rate:Normal     Neuro/Psych  Headaches, PSYCHIATRIC DISORDERS Anxiety Depression    GI/Hepatic negative GI ROS, Neg liver ROS,   Endo/Other  Morbid obesity  Renal/GU negative Renal ROS  negative genitourinary   Musculoskeletal negative musculoskeletal ROS (+)   Abdominal   Peds negative pediatric ROS (+)  Hematology negative hematology ROS (+)   Anesthesia Other Findings Past Medical History:   Anxiety                                                      Depression                                                   Headache                                                     Anemia                                                       Reproductive/Obstetrics negative OB ROS                             Anesthesia Physical Anesthesia Plan  ASA: III  Anesthesia Plan: MAC and Spinal   Post-op Pain Management:    Induction:   Airway Management Planned:   Additional Equipment:   Intra-op Plan:   Post-operative Plan:   Informed Consent: I have reviewed the patients History and Physical, chart, labs and discussed the procedure including the risks, benefits and alternatives for the proposed anesthesia with the patient or authorized representative who has indicated his/her  understanding and acceptance.   Dental Advisory Given  Plan Discussed with: Anesthesiologist, CRNA and Surgeon  Anesthesia Plan Comments:         Anesthesia Quick Evaluation

## 2014-12-18 NOTE — Progress Notes (Signed)
Anticoagulation monitoring  49 yo female s/p L THA ordered enoxaparin 40 mg Sq q24h for DVT ppx.  Body mass index is 40.61 kg/(m^2). Ht 68", Wt 121.1 kg SCr 0.68, CrCl ~ 119 ml/min Hgb 13.3, plt 208  Based on BMI >40 and CrCl > 30 ml/min, will adjust to enoxaparin 40 mg SQ q12h per hospital policy. Pharmacy will continue to monitor.   Crist Fat, PharmD, BCPS Clinical Pharmacist 12/18/2014 2:04 PM

## 2014-12-18 NOTE — Op Note (Signed)
12/18/2014  9:34 AM  PATIENT:  Kimberly Nixon  49 y.o. female  PRE-OPERATIVE DIAGNOSIS:  OSTEOARTHRITIS primary left hip  POST-OPERATIVE DIAGNOSIS:  OSTEOARTHRITIS  PROCEDURE:  Procedure(s): TOTAL HIP ARTHROPLASTY ANTERIOR APPROACH (Left)  SURGEON: Leitha Schuller, MD  ASSISTANTS: None  ANESTHESIA:   spinal  EBL:  Total I/O In: 1025 [I.V.:900; Blood:125] Out: 350 [Urine:50; Blood:300]  BLOOD ADMINISTERED:125 CC CELLSAVER  DRAINS: none   LOCAL MEDICATIONS USED:  MARCAINE     SPECIMEN:  Source of Specimen:  Left femoral head  DISPOSITION OF SPECIMEN:  PATHOLOGY  COUNTS:  YES  TOURNIQUET:  * No tourniquets in log *  IMPLANTS: Medacta AMIS for standard stem,Mpact DM 56 mm cup with liner and S 28 mm head  DICTATION: .Dragon Dictation   The patient was brought to the operating room and after spinal anesthesia was obtained patient was placed on the operative table with the ipsilateral foot into the Medacta attachment, contralateral leg on a well-padded table. C-arm was brought in and preop template x-ray taken. After prepping and draping in usual sterile fashion appropriate patient identification and timeout procedures were completed. Anterior approach to the hip was obtained and centered over the greater trochanter and TFL muscle with a bikini type skin incision. The subcutaneous tissue was incised hemostasis being achieved by electrocautery. TFL fascia was incised and the muscle retracted laterally deep retractor placed. The lateral femoral circumflex vessels were identified and ligated. The anterior capsule was exposed and a capsulotomy performed. The neck was identified and a femoral neck cut carried out with a saw. The head was removed without difficulty and showed sclerotic femoral head and acetabulum. Reaming was carried out to 54 mm and a 56 mm cup trial gave appropriate tightness to the acetabular component a 50 Mpact cup was impacted into position. The leg was then externally  rotated and ischiofemoral and patellofemoral releases carried out. The femur was sequentially broached to a size 4, size 4 stem and S trials were placed and the final components chosen. The  4 stem was inserted along with a S 28 mm head and 56 mm liner. The hip was reduced and was stable the wound was thoroughly irrigated with a dilute Betadine solution. The deep fascia view. Using a heavy Quill after infiltration of 30 cc of quarter percent Sensorcaine with epinephrine.to2-0 Quill to close the skin with skin staples Provine was used to cover the incision and patient was sent to recovery in stable condition complications none specimen removed femoral head issues.   PLAN OF CARE: Admit to inpatient

## 2014-12-18 NOTE — H&P (Signed)
Reviewed paper H+P, will be scanned into chart. No changes noted.  

## 2014-12-18 NOTE — Anesthesia Postprocedure Evaluation (Signed)
  Anesthesia Post-op Note  Patient: Kimberly Nixon  Procedure(s) Performed: Procedure(s): TOTAL HIP ARTHROPLASTY ANTERIOR APPROACH (Left)  Anesthesia type:MAC, Spinal  Patient location: PACU  Post pain: Pain level controlled  Post assessment: Post-op Vital signs reviewed, Patient's Cardiovascular Status Stable, Respiratory Function Stable, Patent Airway and No signs of Nausea or vomiting  Post vital signs: Reviewed and stable  Last Vitals:  Filed Vitals:   12/18/14 1106  BP: 116/65  Pulse: 69  Temp: 35.9 C  Resp: 16    Level of consciousness: awake, alert  and patient cooperative  Complications: No apparent anesthesia complications

## 2014-12-18 NOTE — Progress Notes (Addendum)
Patient arrived from PACU this shift.  Initially moving very well and did not complain of too much pain.  As afternoon progressed pain increased.  Patient taking IV and PO pain medication and worked with PT this shift.  Patient has Pravena negative pressure wound vac due to increased folds and girth to ensure proper healing.  Wound vac remains for 1 week per Dr. Rosita Kea.

## 2014-12-19 ENCOUNTER — Encounter: Payer: Self-pay | Admitting: Orthopedic Surgery

## 2014-12-19 MED ORDER — ENOXAPARIN SODIUM 40 MG/0.4ML ~~LOC~~ SOLN: 40.0000 mg | SUBCUTANEOUS | Status: DC

## 2014-12-19 MED ORDER — OXYCODONE HCL 5 MG PO TABS: 5.0000 mg | ORAL_TABLET | ORAL | Status: DC | PRN

## 2014-12-19 NOTE — Care Management Note (Addendum)
Case Management Note  Patient Details  Name: Deshayla Gionfriddo MRN: [id number] Date of Birth: 03/04/1966  Subjective/Objective:                  Met with patient who slept on and off during my assessment. She states she lives with her son Jonathan and her nephew (which drives). She plans to return home at discharge. She has no DME and requests rolling walker, bedside commode, and shower chair. She is not familiar with home health agencies and is undecided at this time. She uses [facility] [phone] for Rx. She will wear a disposable Pravena VAC to the home at discharge- RNCM will not need to arrange as PT to monitor this need with home health.   Action/Plan: List of home health agencies left with patient. Lovenox 40mg #14 called [facility] to [facility] for price. RNCM will continue to follow.   Expected Discharge Date:  12/21/14               Expected Discharge Plan:     In-House Referral:     Discharge planning Services  CM Consult  Post Acute Care Choice:  Durable Medical Equipment, Home Health Choice offered to:  Patient  DME Arranged:  Walker rolling, Bedside commode, Shower stool DME Agency:  [facility]  HH Arranged:  PT HH Agency:   [facility]  Status of Service:  In process, will continue to follow  Medicare Important Message Given:    Date Medicare IM Given:    Medicare IM give by:    Date Additional Medicare IM Given:    Additional Medicare Important Message give by:     If discussed at Long Length of Stay Meetings, dates discussed:    Additional Comments: $91.51 Lovenox cost. Patient aware and agrees. She has picked [facility]- referral called to Tim/Jerry with [facility].   Angela Johnson, RN 12/19/2014, 10:28 AM  

## 2014-12-19 NOTE — Progress Notes (Signed)
Physical Therapy Treatment Patient Details Name: Kimberly Nixon MRN: [id number] DOB: 04/16/1965 Today's Date: 12/19/2014    History of Present Illness S/p L total hip, anterior approach    PT Comments    Pt shows good effort with PT but fatigues quickly with the effort of walking/stairs and her HR gets up to the 140s.  She was heavily reliant on the walker, and showed some hesitancy but did not appear overly tired until she felt lightheaded and needed to sit. Further ambulation deferred at this time, pt shows good effort with exercises.   Follow Up Recommendations  Home health PT;Supervision - Intermittent     Equipment Recommendations  Rolling walker with 5" wheels;3in1 (PT)    Recommendations for Other Services       Precautions / Restrictions Precautions Precautions: Anterior Hip Restrictions LLE Weight Bearing: Weight bearing as tolerated    Mobility  Bed Mobility Overal bed mobility: Modified Independent             General bed mobility comments: Pt sitting up right at EOB, did not test bed mobility  Transfers Overall transfer level: Modified independent Equipment used: Rolling walker (2 wheeled) Transfers: Sit to/from Stand Sit to Stand: Min guard         General transfer comment: Pt with good hand placement, able to rise and control descent and did not need much cuing for set up.  Ambulation/Gait Ambulation/Gait assistance: Min guard;Min assist Ambulation Distance (Feet): 40 Feet Assistive device: Rolling walker (2 wheeled)       General Gait Details: Pt very reliant on the walker and still obviously favoring the R LE.  She has no balance issues but fatigues quickly (HR increases to 140s) and feels like she needs to quickly sit with activity.     Stairs Stairs: Yes Stairs assistance: Min assist Stair Management: One rail Right Number of Stairs: 5 General stair comments: Pt struggles with single rail but is able to negotiate up/down steps.  She  becomes very fatigued with the effort and needs to sit when she gets to the base of the steps.  Wheelchair Mobility    Modified Rankin (Stroke Patients Only)       Balance                                    Cognition Arousal/Alertness: Awake/alert Behavior During Therapy: WFL for tasks assessed/performed Overall Cognitive Status: Within Functional Limits for tasks assessed                      Exercises General Exercises - Lower Extremity Long Arc Quad: Strengthening;Both;15 reps;Seated Heel Slides: Strengthening;Both;15 reps;Supine Hip ABduction/ADduction: Strengthening;Both;15 reps;Seated Mini-Sqauts: AROM;10 reps    General Comments        Pertinent Vitals/Pain Pain Assessment: 0-10 Pain Score: 3     Home Living                      Prior Function            PT Goals (current goals can now be found in the care plan section) Progress towards PT goals: Progressing toward goals    Frequency  BID    PT Plan Current plan remains appropriate    Co-evaluation             End of Session Equipment Utilized During Treatment: Gait belt Activity Tolerance: Patient limited by  fatigue Patient left: with nursing/sitter in room;in chair     Time: 1610-9604 PT Time Calculation (min) (ACUTE ONLY): 28 min  Charges:  $Gait Training: 8-22 mins $Therapeutic Exercise: 8-22 mins                    G Codes:     Kimberly Nixon, PT, DPT [phone]  Kimberly Nixon 12/19/2014, 2:56 PM

## 2014-12-19 NOTE — Progress Notes (Signed)
OT Cancellation Note  Patient Details Name: Kimberly Nixon MRN: [id number] DOB: 10-Sep-1965   Cancelled Treatment:    Reason Eval/Treat Not Completed: Patient declined, no reason specified. Patient refused Occupational Therapy.  Ocie Cornfield 12/19/2014, 3:02 PM

## 2014-12-19 NOTE — Progress Notes (Signed)
Physical Therapy Treatment Patient Details Name: Kimberly Nixon MRN: [id number] DOB: 08/13/1965 Today's Date: 12/19/2014    History of Present Illness Pt underwent L THA without reported post-op complications. Evaluation performed on POD#0. Pt reports full independence with ADLs/IADLs prior to admission. Pt was driving and ambulating limited community distances without assistive device. No reported falls in the last 12 months    PT Comments    Pt demonstrates improving transfers and ambulation on this date. Her ambulation distance is primarily self-limited and pt encouraged to attempt ambulation to RN station this afternoon. Pt is able to complete all seated and supine exercises as instructed with improving LLE strength. Pt will benefit from skilled PT services to address deficits in strength, balance, and mobility in order to return to full function at home.    Follow Up Recommendations  Home health PT;Supervision - Intermittent     Equipment Recommendations  Rolling walker with 5" wheels    Recommendations for Other Services       Precautions / Restrictions Precautions Precautions: Anterior Hip Precaution Booklet Issued: Yes (comment) Restrictions Weight Bearing Restrictions: Yes LLE Weight Bearing: Weight bearing as tolerated    Mobility  Bed Mobility Overal bed mobility: Needs Assistance Bed Mobility: Sit to Supine       Sit to supine: Supervision   General bed mobility comments: Pt received sitting upright at EOB. Educated pt about how to use RLE to assist with return to bed but pt demonstrates adequate L hip flexion strength that she doesn't need assistance. Good speed and sequencing noted.  Transfers Overall transfer level: Needs assistance Equipment used: Rolling walker (2 wheeled)   Sit to Stand: Min guard         General transfer comment: Improving weight acceptance to LLE. Cues again provided for proper hand placement during sequencing. Pt with improving  speed and strength with less reports of pain.  Ambulation/Gait Ambulation/Gait assistance: Min guard Ambulation Distance (Feet): 30 Feet Assistive device: Rolling walker (2 wheeled)     Gait velocity interpretation: <1.8 ft/sec, indicative of risk for recurrent falls General Gait Details: Pt educated again about proper sequencing with walker. Cues for L heel strike as well as increased weight acceptance to LLE. Pt intially ambulating on L forefoot only but able to correct with cues. Decreased gait speed. Pt is self-limiting with gait distance. Encouraged to RN station this PM   Stairs            Wheelchair Mobility    Modified Rankin (Stroke Patients Only)       Balance Overall balance assessment: Needs assistance   Sitting balance-Leahy Scale: Good       Standing balance-Leahy Scale: Fair                      Cognition Arousal/Alertness: Awake/alert Behavior During Therapy: WFL for tasks assessed/performed Overall Cognitive Status: Within Functional Limits for tasks assessed                      Exercises General Exercises - Lower Extremity Ankle Circles/Pumps: Strengthening;Both;10 reps;Seated Long Arc Quad: Strengthening;Both;15 reps;Seated Heel Slides: Strengthening;Both;15 reps;Supine Hip ABduction/ADduction: Strengthening;Both;15 reps;Seated (Performed supine as well) Straight Leg Raises: Strengthening;Both;15 reps;Supine Hip Flexion/Marching: Strengthening;Both;15 reps;Seated Toe Raises: Strengthening;5 reps;Seated;Both Heel Raises: Strengthening;Both;15 reps;Seated    General Comments        Pertinent Vitals/Pain Pain Assessment: 0-10 Pain Score: 4  Pain Location: L hip Pain Descriptors / Indicators: Aching Pain Intervention(s): Monitored during session;Premedicated  before session;Repositioned    Home Living                      Prior Function            PT Goals (current goals can now be found in the care plan  section) Acute Rehab PT Goals Patient Stated Goal: "I really want the pain to go away" PT Goal Formulation: With patient Time For Goal Achievement: 01/01/15 Potential to Achieve Goals: Good Progress towards PT goals: Progressing toward goals    Frequency  BID    PT Plan Current plan remains appropriate    Co-evaluation             End of Session Equipment Utilized During Treatment: Gait belt Activity Tolerance: Patient limited by pain Patient left: with call bell/phone within reach;in bed;with bed alarm set;with SCD's reapplied (Refuses up to recliner due to discomfort)     Time: 4098-1191 PT Time Calculation (min) (ACUTE ONLY): 27 min  Charges:  $Gait Training: 8-22 mins $Therapeutic Exercise: 8-22 mins                    G Codes:      [facility] PT, DPT   Raelin Pixler 12/19/2014, 9:02 AM

## 2014-12-19 NOTE — Discharge Instructions (Signed)

## 2014-12-19 NOTE — Progress Notes (Signed)
   Subjective: 1 Day Post-Op Procedure(s) (LRB): TOTAL HIP ARTHROPLASTY ANTERIOR APPROACH (Left) Patient reports pain as moderate.   Patient is well, and has had no acute complaints or problems We will start therapy today.  Plan is to go Home after hospital stay.  Objective: Vital signs in last 24 hours: Temp:  [96.7 F (35.9 C)-99.5 F (37.5 C)] 99.2 F (37.3 C) (10/07 0747) Pulse Rate:  [69-93] 91 (10/07 0747) Resp:  [15-23] 18 (10/07 0747) BP: (90-123)/(45-80) 115/68 mmHg (10/07 0747) SpO2:  [93 %-100 %] 93 % (10/07 0747) Weight:  [121.11 kg (267 lb)] 121.11 kg (267 lb) (10/06 1119)  Intake/Output from previous day: 10/06 0701 - 10/07 0700 In: 1365 [P.O.:240; I.V.:1000; Blood:125] Out: 2550 [Urine:2250; Blood:300] Intake/Output this shift:     Recent Labs  12/18/14 1230  HGB 13.3    Recent Labs  12/18/14 1230  WBC 19.9*  RBC 4.77  HCT 40.4  PLT 208    Recent Labs  12/18/14 1230  CREATININE 0.68   No results for input(s): LABPT, INR in the last 72 hours.  EXAM General - Patient is Alert, Appropriate and Oriented Extremity - Neurovascular intact Sensation intact distally Intact pulses distally Dorsiflexion/Plantar flexion intact Dressing - dressing C/D/I, wound vac intact Motor Function - intact, moving foot and toes well on exam.   Past Medical History  Diagnosis Date  . Anxiety   . Depression   . Headache   . Anemia     Assessment/Plan:   1 Day Post-Op Procedure(s) (LRB): TOTAL HIP ARTHROPLASTY ANTERIOR APPROACH (Left) Active Problems:   Primary osteoarthritis of left hip  Estimated body mass index is 40.61 kg/(m^2) as calculated from the following:   Height as of this encounter:  (1.727 m).   Weight as of this encounter: 121.11 kg (267 lb). Advance diet Up with therapy Needs BM Recheck labs in the am Plan to discharge pt home with provena wound vac.   DVT Prophylaxis - Lovenox, Foot Pumps and TED hose Weight-Bearing as  tolerated to left leg D/C O2 and Pulse OX and try on Room Air  T. Cranston Neighbor, PA-C [facility] 12/19/2014, 7:58 AM

## 2014-12-19 NOTE — Progress Notes (Signed)
Clinical Social Worker (CSW) received SNF consult. PT is recommending home health. RN Case Manager aware of above. Please reconsult if future social work needs arise. CSW signing off.   Ebany Bowermaster Morgan, LCSWA [phone] 

## 2014-12-20 LAB — CBC
HEMATOCRIT: 35.4 % (ref 35.0–47.0)
Hemoglobin: 11.6 g/dL — ABNORMAL LOW (ref 12.0–16.0)
MCH: 27.9 pg (ref 26.0–34.0)
MCHC: 32.9 g/dL (ref 32.0–36.0)
MCV: 85 fL (ref 80.0–100.0)
Platelets: 190 10*3/uL (ref 150–440)
RBC: 4.16 MIL/uL (ref 3.80–5.20)
RDW: 14.1 % (ref 11.5–14.5)
WBC: 11.2 10*3/uL — AB (ref 3.6–11.0)

## 2014-12-20 LAB — BASIC METABOLIC PANEL
ANION GAP: 7 (ref 5–15)
BUN: 10 mg/dL (ref 6–20)
CHLORIDE: 104 mmol/L (ref 101–111)
CO2: 27 mmol/L (ref 22–32)
Calcium: 8.2 mg/dL — ABNORMAL LOW (ref 8.9–10.3)
Creatinine, Ser: 0.84 mg/dL (ref 0.44–1.00)
GFR calc Af Amer: >60 mL/min (ref >60)
GLUCOSE: 122 mg/dL — AB (ref 65–99)
POTASSIUM: 3.9 mmol/L (ref 3.5–5.1)
Sodium: 138 mmol/L (ref 135–145)

## 2014-12-20 NOTE — Progress Notes (Addendum)
  Subjective: 2 Days Post-Op Procedure(s) (LRB): TOTAL HIP ARTHROPLASTY ANTERIOR APPROACH (Left) Patient reports pain as mild.   Patient seen in rounds with Dr. Ernest Pine. Patient is well, and has had no acute complaints or problems Plan is to go Home after hospital stay. Negative for chest pain and shortness of breath Fever: no Gastrointestinal: Negative for nausea and vomiting  Objective: Vital signs in last 24 hours: Temp:  [99 F (37.2 C)-99.2 F (37.3 C)] 99.1 F (37.3 C) (10/08 0421) Pulse Rate:  [81-91] 87 (10/08 0421) Resp:  [18] 18 (10/08 0421) BP: (100-127)/(55-74) 112/55 mmHg (10/08 0421) SpO2:  [93 %-100 %] 94 % (10/08 0421)  Intake/Output from previous day:  Intake/Output Summary (Last 24 hours) at 12/20/14 0617 Last data filed at 12/19/14 1039  Gross per 24 hour  Intake      0 ml  Output    550 ml  Net   -550 ml    Intake/Output this shift:    Labs:  Recent Labs  12/18/14 1230 12/20/14 0331  HGB 13.3 11.6*    Recent Labs  12/18/14 1230 12/20/14 0331  WBC 19.9* 11.2*  RBC 4.77 4.16  HCT 40.4 35.4  PLT 208 190    Recent Labs  12/18/14 1230 12/20/14 0331  NA  --  138  K  --  3.9  CL  --  104  CO2  --  27  BUN  --  10  CREATININE 0.68 0.84  GLUCOSE  --  122*  CALCIUM  --  8.2*   No results for input(s): LABPT, INR in the last 72 hours.   EXAM General - Patient is Alert and Oriented Extremity - Sensation intact distally Dorsiflexion/Plantar flexion intact No cellulitis present Compartment soft Dressing/Incision - provena wound VAC in place Motor Function - intact, moving foot and toes well on exam. The patient ambulated 40 feet on postop day 1.  Past Medical History  Diagnosis Date  . Anxiety   . Depression   . Headache   . Anemia     Assessment/Plan: 2 Days Post-Op Procedure(s) (LRB): TOTAL HIP ARTHROPLASTY ANTERIOR APPROACH (Left) Active Problems:   Primary osteoarthritis of left hip  Estimated body mass index is  40.61 kg/(m^2) as calculated from the following:   Height as of this encounter:  (1.727 m).   Weight as of this encounter: 121.11 kg (267 lb). Plan for discharge tomorrow  DVT Prophylaxis - Lovenox, Foot Pumps and TED hose Weight-Bearing as tolerated to left leg  Dedra Skeens, PA-C Orthopaedic Surgery 12/20/2014, 6:17 AM

## 2014-12-20 NOTE — Discharge Summary (Signed)
Physician Discharge Summary  Patient ID: Kimberly Nixon MRN: [id number] DOB/AGE: 47-Feb-1967 47 y.o.  Admit date: 12/18/2014 Discharge date: 12/21/2014  Admission Diagnoses:  OSTEOARTHRITIS   Discharge Diagnoses: Patient Active Problem List   Diagnosis Date Noted  . Primary osteoarthritis of left hip 12/18/2014    Past Medical History  Diagnosis Date  . Anxiety   . Depression   . Headache   . Anemia      Transfusion: None   Consultants (if any):    Discharged Condition: Improved  Hospital Course: Kimberly Nixon is an 49 y.o. female who was admitted 12/18/2014 with a diagnosis of primary osteoarthritis of left hip and went to the operating room on 12/18/2014 and underwent the above named procedures.    Surgeries: Procedure(s): TOTAL HIP ARTHROPLASTY ANTERIOR APPROACH on 12/18/2014 Patient tolerated the surgery well. Taken to PACU where she was stabilized and then transferred to the orthopedic floor.  Started on Lovenox 40 q 12 hrs. Foot pumps applied bilaterally at 80 mm. Heels elevated on bed with rolled towels. No evidence of DVT. Negative Homan. Physical therapy started on day #1 for gait training and transfer. OT started day #1 for ADL and assisted devices.  Patient's foley was d/c on day #1. Patient's IV  was d/c on day #2.  On post op day #3 patient was stable and ready for discharge to home with home health physical therapy. She was discharged home with provena wound VAC            Implants:  Medacta AMIS for standard stem,Mpact DM 56 mm cup with liner and S 28 mm head  She was given perioperative antibiotics:  Anti-infectives    Start     Dose/Rate Route Frequency Ordered Stop   12/18/14 1200  ceFAZolin (ANCEF) 3 g in dextrose 5 % 50 mL IVPB     3 g 160 mL/hr over 30 Minutes Intravenous 4 times per day 12/18/14 1056 12/19/14 0057   12/18/14 0813  gentamicin (GARAMYCIN) injection  Status:  Discontinued       As needed 12/18/14 0814 12/18/14 0928   12/18/14  0600  ceFAZolin (ANCEF) 3 g in dextrose 5 % 50 mL IVPB     3 g 160 mL/hr over 30 Minutes Intravenous  Once 12/18/14 0552 12/18/14 0805    .  She was given sequential compression devices, early ambulation, and Lovenox for DVT prophylaxis.  She benefited maximally from the hospital stay and there were no complications.    Recent vital signs:  Filed Vitals:   12/20/14 1527  BP: 109/58  Pulse: 95  Temp: 98.2 F (36.8 C)  Resp: 18    Recent laboratory studies:  Lab Results  Component Value Date   HGB 11.6* 12/20/2014   HGB 13.3 12/18/2014   HGB 13.7 12/11/2014   Lab Results  Component Value Date   WBC 11.2* 12/20/2014   PLT 190 12/20/2014   Lab Results  Component Value Date   INR 1.00 12/11/2014   Lab Results  Component Value Date   NA 138 12/20/2014   K 3.9 12/20/2014   CL 104 12/20/2014   CO2 27 12/20/2014   BUN 10 12/20/2014   CREATININE 0.84 12/20/2014   GLUCOSE 122* 12/20/2014    Discharge Medications:     Medication List    TAKE these medications        enoxaparin 40 MG/0.4ML injection  Commonly known as:  LOVENOX  Inject 0.4 mLs (40 mg total) into the skin daily.  ibuprofen 200 MG tablet  Commonly known as:  ADVIL,MOTRIN  Take 800 mg by mouth every 6 (six) hours as needed.     oxyCODONE 5 MG immediate release tablet  Commonly known as:  Oxy IR/ROXICODONE  Take 1-2 tablets (5-10 mg total) by mouth every 3 (three) hours as needed for breakthrough pain.     progesterone 200 MG capsule  Commonly known as:  PROMETRIUM  Take 200 mg by mouth daily.     sertraline 50 MG tablet  Commonly known as:  ZOLOFT  Take 50 mg by mouth daily.     traMADol 50 MG tablet  Commonly known as:  ULTRAM  Take 50 mg by mouth every 6 (six) hours as needed.        Diagnostic Studies: Dg Hip Operative Unilat With Pelvis Left  12/18/2014   CLINICAL DATA:  Hip replacement.  EXAM: OPERATIVE left HIP (WITH PELVIS IF PERFORMED)  VIEWS  TECHNIQUE: Two fluoroscopic  spot image(s) were submitted for interpretation post-operatively. Fluoro time 0 minutes 29 seconds.  COMPARISON:  None.  FINDINGS: Total left hip replacement with good anatomic alignment on AP view. No focal abnormality.  IMPRESSION: Total left hip replacement with good anatomic alignment on AP view.   Electronically Signed   By: Maisie Fus  Register   On: 12/18/2014 09:19   Dg Hip Unilat W Or W/o Pelvis 2-3 Views Left  12/18/2014   CLINICAL DATA:  Status post left total hip arthroplasty.  EXAM: DG HIP (WITH OR WITHOUT PELVIS) 2-3V LEFT  COMPARISON:  Same day.  FINDINGS: The femoral and acetabular components appear to be well situated. Surgical staples and postoperative gas are noted. No fracture or dislocation is noted.  IMPRESSION: Status post left total hip arthroplasty.   Electronically Signed   By: Lupita Raider, M.D.   On: 12/18/2014 10:16    Disposition: Discharge home with home health physical therapy       Follow-up Information    Follow up with MENZ,MICHAEL, MD In 2 weeks.   Specialty:  Orthopedic Surgery   Why:  For staple removal and skin check   Contact information:   [address] [facility] [place] [address] [phone]        Signed: Patience Musca 12/20/2014, 9:11 PM

## 2014-12-20 NOTE — Evaluation (Signed)
Occupational Therapy Evaluation Patient Details Name: Kimberly Nixon MRN: [id number] DOB: [birth year] Today's Date: 12/20/2014    History of Present Illness Patient was admitted to [facility] for a left total hip replacement with anterior approach.   Clinical Impression   Patient is a 49 yo female admitted to [facility] for a left total hip replacement with anterior approach.  She lives at home with her son and nephew who are both grown, in a 2 story home with 1 step to enter.  Her bedroom is on the 2nd floor as well as her personal bathroom.  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  She anticipates going home upon discharge.  She was evaluated this date and seen for toileting and instruction on lower body dressing techniques.  She was able to ambulate to and from the bathroom with a rolling walker and SBA to supervision.  She completed toilet transfers with modified independence using an elevated commode.  She was instructed on lower body dressing techniques and demos verbal understanding.  She does not feel she needs any additional OT and therefore will discharge patient at this time with evaluation only.  She will benefit from a 3 in one commode and shower chair.    Follow Up Recommendations  No OT follow up    Equipment Recommendations  3 in 1 bedside comode;Tub/shower seat    Recommendations for Other Services       Precautions / Restrictions Precautions Precautions: Anterior Hip;Fall Restrictions Weight Bearing Restrictions: Yes LLE Weight Bearing: Weight bearing as tolerated      Mobility Bed Mobility                  Transfers Overall transfer level: Modified independent Equipment used: Rolling walker (2 wheeled) Transfers: Sit to/from Stand Sit to Stand: Supervision              Balance                                            ADL Overall ADL's  : Needs assistance/impaired Eating/Feeding: Independent   Grooming: Supervision/safety   Upper Body Bathing: Independent   Lower Body Bathing: Minimal assistance   Upper Body Dressing : Independent   Lower Body Dressing: Minimal assistance   Toilet Transfer: Supervision/safety           Functional mobility during ADLs: Supervision/safety General ADL Comments: Patient reports her family will be helping her at home, she is not interested in dressing equipment.     Vision     Perception     Praxis      Pertinent Vitals/Pain Pain Score: 8  Pain Location: left knee and hip Pain Descriptors / Indicators: Aching Pain Intervention(s): Monitored during session;RN gave pain meds during session     Hand Dominance Right   Extremity/Trunk Assessment Upper Extremity Assessment Upper Extremity Assessment: Overall WFL for tasks assessed   Lower Extremity Assessment Lower Extremity Assessment: Defer to PT evaluation       Communication Communication Communication: No difficulties   Cognition Arousal/Alertness: Awake/alert Behavior During Therapy: WFL for tasks assessed/performed Overall Cognitive Status: Within Functional Limits for tasks assessed                     General Comments  Exercises       Shoulder Instructions      Home Living Family/patient expects to be discharged to:: Private residence Living Arrangements: Children;Other relatives Available Help at Discharge: Family Type of Home: House Home Access: Stairs to enter [employer] of Steps: one Entrance Stairs-Rails: None Home Layout: Multi-level;Bed/bath upstairs Alternate Level Stairs-Number of Steps: 12 Alternate Level Stairs-Rails: Right Bathroom Shower/Tub: Chief Strategy Officer: Standard Bathroom Accessibility: Yes   Home Equipment: None   Additional Comments: Patient would benefit from a bedside commode and shower chair      Prior  Functioning/Environment Level of Independence: Independent        Comments: Limited community ambulation without assistive device    OT Diagnosis: Generalized weakness;Other (comment) (decreased ability to perform ADLs)   OT Problem List: Decreased strength;Pain;Decreased knowledge of use of DME or AE   OT Treatment/Interventions:      OT Goals(Current goals can be found in the care plan section) Acute Rehab OT Goals Patient Stated Goal: Patient reports she wants to be able to go home and be independent, however she does not feel she needs OT and family will assist her initially with self care tasks if needed. OT Goal Formulation: With patient Potential to Achieve Goals: Good  OT Frequency:     Barriers to D/C:            Co-evaluation              End of Session Equipment Utilized During Treatment: Rolling walker Nurse Communication: Mobility status  Activity Tolerance: Patient tolerated treatment well Patient left: in chair;with nursing/sitter in room   Time: 0805-0830 OT Time Calculation (min): 25 min Charges:  OT General Charges $OT Visit: 1 Procedure OT Evaluation $Initial OT Evaluation Tier I: 1 Procedure OT Treatments $Self Care/Home Management : 8-22 mins G-Codes:    Virga Haltiwanger 2014/12/31, 8:42 AM

## 2014-12-20 NOTE — Progress Notes (Signed)
Physical Therapy Treatment Patient Details Name: Kimberly Nixon MRN: [id number] DOB: 02-Feb-1966 Today's Date: 12/20/2014    History of Present Illness Patient was admitted to [facility] for a left total hip replacement with anterior approach.    PT Comments    Pt is able to ambulate > 100 ft and does not have any safety concerns but she does continues to fatigue with ambulation and her HR increased to 140s.  She did well with exercises but is still unable to do SLR, pain has been under control.  Follow Up Recommendations  Home health PT;Supervision - Intermittent     Equipment Recommendations  Rolling walker with 5" wheels;3in1 (PT)    Recommendations for Other Services       Precautions / Restrictions Precautions Precautions: Anterior Hip;Fall Restrictions Weight Bearing Restrictions: Yes LLE Weight Bearing: Weight bearing as tolerated    Mobility  Bed Mobility Overal bed mobility: Modified Independent Bed Mobility: Sit to Supine;Supine to Sit     Supine to sit: Supervision Sit to supine: Supervision   General bed mobility comments: Pt does well with bed mobility, does need rails to assist  Transfers Overall transfer level: Modified independent Equipment used: Rolling walker (2 wheeled) Transfers: Sit to/from Stand Sit to Stand: Supervision         General transfer comment: No safety issues with rising to standing or controlling descent  Ambulation/Gait Ambulation/Gait assistance: Supervision Ambulation Distance (Feet): 125 Feet Assistive device: Rolling walker (2 wheeled)       General Gait Details: Pt again ambulate with improved confidence and does not have safety issues but her HR increases to 140s again with the effort.  She does not feel faint, but is fatigued after ambulating.   Stairs            Wheelchair Mobility    Modified Rankin (Stroke Patients Only)       Balance                                    Cognition  Arousal/Alertness: Awake/alert Behavior During Therapy: WFL for tasks assessed/performed Overall Cognitive Status: Within Functional Limits for tasks assessed                      Exercises Total Joint Exercises Ankle Circles/Pumps: AROM;10 reps Quad Sets: Strengthening;10 reps Heel Slides: Strengthening;10 reps Hip ABduction/ADduction: Strengthening;10 reps Straight Leg Raises: AAROM;10 reps General Exercises - Lower Extremity Ankle Circles/Pumps: Strengthening;Both;10 reps;Seated Long Arc Quad: Strengthening;Both;15 reps;Seated Heel Slides: Strengthening;Both;15 reps;Supine Hip ABduction/ADduction: Strengthening;Both;15 reps;Seated Hip Flexion/Marching: Strengthening;Both;15 reps;Seated    General Comments        Pertinent Vitals/Pain Pain Score: 4     Home Living                      Prior Function            PT Goals (current goals can now be found in the care plan section) Progress towards PT goals: Progressing toward goals    Frequency  BID    PT Plan Current plan remains appropriate    Co-evaluation             End of Session Equipment Utilized During Treatment: Gait belt Activity Tolerance: Patient tolerated treatment well Patient left: with bed alarm set     Time: 4098-1191 PT Time Calculation (min) (ACUTE ONLY): 28 min  Charges:  $Gait Training:  8-22 mins $Therapeutic Exercise: 8-22 mins                    G Codes:     Loran Senters, PT, DPT [phone]  Malachi Pro 12/20/2014, 4:02 PM

## 2014-12-20 NOTE — Progress Notes (Signed)
Physical Therapy Treatment Patient Details Name: Kimberly Nixon MRN: [id number] DOB: [birth year] Today's Date: 12/20/2014    History of Present Illness Patient was admitted to [facility] for a left total hip replacement with anterior approach.    PT Comments    Pt does much better with ambulation today (tolerance and cadence) though she still does have an increase in HR up to 130 with the effort.  She shows good effort and motivation with walking and exercises and is making gains with LE strength.    Follow Up Recommendations  Home health PT;Supervision - Intermittent     Equipment Recommendations  Rolling walker with 5" wheels;3in1 (PT)    Recommendations for Other Services       Precautions / Restrictions Precautions Precautions: Anterior Hip;Fall Restrictions Weight Bearing Restrictions: Yes LLE Weight Bearing: Weight bearing as tolerated    Mobility  Bed Mobility               General bed mobility comments: Pt up in recliner on arrival  Transfers Overall transfer level: Modified independent Equipment used: Rolling walker (2 wheeled)   Sit to Stand: Supervision         General transfer comment: Pt able to rise w/o issue and she is able to maintain balance well with light walker use  Ambulation/Gait Ambulation/Gait assistance: Min guard (chair following) Ambulation Distance (Feet): 150 Feet Assistive device: Rolling walker (2 wheeled)       General Gait Details: Pt does much better with ambulation today showing increased toleranceand confidence.  She does have increased HR (from 90s to 130s) with the effort, but does not have the lightheadedness or need to stop like yesterday.   Stairs            Wheelchair Mobility    Modified Rankin (Stroke Patients Only)       Balance                                    Cognition Arousal/Alertness: Awake/alert Behavior During Therapy: WFL for tasks assessed/performed Overall Cognitive  Status: Within Functional Limits for tasks assessed                      Exercises General Exercises - Lower Extremity Ankle Circles/Pumps: Strengthening;Both;10 reps;Seated Long Arc Quad: Strengthening;Both;15 reps;Seated Heel Slides: Strengthening;Both;15 reps;Supine Hip ABduction/ADduction: Strengthening;Both;15 reps;Seated Hip Flexion/Marching: Strengthening;Both;15 reps;Seated    General Comments        Pertinent Vitals/Pain Pain Score: 5     Home Living                      Prior Function            PT Goals (current goals can now be found in the care plan section) Progress towards PT goals: Progressing toward goals    Frequency  BID    PT Plan Current plan remains appropriate    Co-evaluation             End of Session Equipment Utilized During Treatment: Gait belt Activity Tolerance: Patient tolerated treatment well Patient left: in chair     Time: 6578-4696 PT Time Calculation (min) (ACUTE ONLY): 26 min  Charges:  $Gait Training: 8-22 mins $Therapeutic Exercise: 8-22 mins                    G Codes:     Loran Senters,  PT, DPT #[id number]  Malachi Pro 12/20/2014, 1:38 PM

## 2014-12-21 MED ORDER — METHOCARBAMOL 500 MG PO TABS: 500.0000 mg | ORAL_TABLET | Freq: Four times a day (QID) | ORAL | Status: DC | PRN

## 2014-12-21 NOTE — Progress Notes (Signed)
  Subjective: 3 Days Post-Op Procedure(s) (LRB): TOTAL HIP ARTHROPLASTY ANTERIOR APPROACH (Left) Patient reports pain as mild.   Patient seen in rounds with Dr. Ernest Pine. Patient is well, and has had no acute complaints or problems Plan is to go Home after hospital stay. Negative for chest pain and shortness of breath Fever: no Gastrointestinal: Negative for nausea and vomiting  Objective: Vital signs in last 24 hours: Temp:  [98 F (36.7 C)-99.5 F (37.5 C)] 98 F (36.7 C) (10/09 0505) Pulse Rate:  [80-95] 84 (10/09 0505) Resp:  [18] 18 (10/09 0505) BP: (95-117)/(46-59) 104/58 mmHg (10/09 0505) SpO2:  [95 %-100 %] 100 % (10/09 0505)  Intake/Output from previous day:  Intake/Output Summary (Last 24 hours) at 12/21/14 0616 Last data filed at 12/20/14 1832  Gross per 24 hour  Intake    600 ml  Output      0 ml  Net    600 ml    Intake/Output this shift:    Labs:  Recent Labs  12/18/14 1230 12/20/14 0331  HGB 13.3 11.6*    Recent Labs  12/18/14 1230 12/20/14 0331  WBC 19.9* 11.2*  RBC 4.77 4.16  HCT 40.4 35.4  PLT 208 190    Recent Labs  12/18/14 1230 12/20/14 0331  NA  --  138  K  --  3.9  CL  --  104  CO2  --  27  BUN  --  10  CREATININE 0.68 0.84  GLUCOSE  --  122*  CALCIUM  --  8.2*   No results for input(s): LABPT, INR in the last 72 hours.   EXAM General - Patient is Alert and Oriented Extremity - Neurovascular intact Dorsiflexion/Plantar flexion intact No cellulitis present Compartment soft Dressing/Incision - Provena wound VAC is in place. Minimal discharge. Motor Function - intact, moving foot and toes well on exam. The patient ambulated 125 feet with physical therapy on postoperative day 2.  Past Medical History  Diagnosis Date  . Anxiety   . Depression   . Headache   . Anemia     Assessment/Plan: 3 Days Post-Op Procedure(s) (LRB): TOTAL HIP ARTHROPLASTY ANTERIOR APPROACH (Left) Active Problems:   Primary osteoarthritis of  left hip  Estimated body mass index is 40.61 kg/(m^2) as calculated from the following:   Height as of this encounter:  (1.727 m).   Weight as of this encounter: 121.11 kg (267 lb). Discharge home with home health  DVT Prophylaxis - Lovenox, Foot Pumps and TED hose Weight-Bearing as tolerated to left leg  Dedra Skeens, PA-C Orthopaedic Surgery 12/21/2014, 6:16 AM

## 2014-12-21 NOTE — Progress Notes (Signed)
Physical Therapy Treatment Patient Details Name: Trysten Berti MRN: [id number] DOB: 20-Apr-1965 Today's Date: 12/21/2014    History of Present Illness Patient was admitted to [facility] for a left total hip replacement with anterior approach.    PT Comments    Pt has her best session yet and did circumambulate the nurses' station.  She continues to have increased HR with the effort (~100 at rest, 130s during ambulation) and though she was able to do 6 steps 2 days ago is not wanting to try a full flight today.    Follow Up Recommendations  Home health PT;Supervision - Intermittent     Equipment Recommendations  Rolling walker with 5" wheels;3in1 (PT)    Recommendations for Other Services       Precautions / Restrictions Precautions Precautions: Anterior Hip;Fall Restrictions Weight Bearing Restrictions: Yes LLE Weight Bearing: Weight bearing as tolerated    Mobility  Bed Mobility Overal bed mobility: Modified Independent Bed Mobility: Sit to Supine;Supine to Sit     Supine to sit: Supervision     General bed mobility comments: Pt is able to get to EOB w/o issue and does not have increased pain with the effort.  Transfers Overall transfer level: Modified independent Equipment used: Rolling walker (2 wheeled) Transfers: Sit to/from Stand Sit to Stand: Supervision         General transfer comment: Pt is able to rise to standing w/o assist, but does need UE support maintain balnace.  Ambulation/Gait Ambulation/Gait assistance: Supervision Ambulation Distance (Feet): 250 Feet Assistive device: Rolling walker (2 wheeled)       General Gait Details: Pt continues to make good gains with ambulation and is actually able to walk ~50 ft with consistent forward momentum showing increased WBing tolerance on R   Stairs            Wheelchair Mobility    Modified Rankin (Stroke Patients Only)       Balance                                     Cognition Arousal/Alertness: Awake/alert Behavior During Therapy: WFL for tasks assessed/performed Overall Cognitive Status: Within Functional Limits for tasks assessed                      Exercises Total Joint Exercises Ankle Circles/Pumps: AROM;10 reps Quad Sets: Strengthening;10 reps Hip ABduction/ADduction: 10 reps;AROM Straight Leg Raises: AAROM;10 reps Long Arc Quad: Strengthening;10 reps    General Comments        Pertinent Vitals/Pain Pain Score: 3     Home Living                      Prior Function            PT Goals (current goals can now be found in the care plan section) Progress towards PT goals: Progressing toward goals    Frequency  BID    PT Plan Current plan remains appropriate    Co-evaluation             End of Session Equipment Utilized During Treatment: Gait belt Activity Tolerance: Patient tolerated treatment well Patient left: with chair alarm set     Time: 7829-5621 PT Time Calculation (min) (ACUTE ONLY): 26 min  Charges:  $Gait Training: 8-22 mins $Therapeutic Exercise: 8-22 mins  G Codes:     Loran Senters, PT, DPT [phone]  Malachi Pro 12/21/2014, 10:45 AM

## 2014-12-21 NOTE — Care Management Note (Addendum)
Case Management Note  Patient Details  Name: Kimberly Nixon MRN: [id number] Date of Birth: Jul 20, 1965  Subjective/Objective:   Referral faxed to [facility] for home health PT. Discussed discharge home today with Pravena wound vac with Dr Ernest Pine who does not think that a home health RN is needed because wound vac will fall off on its own. [name] has a rolling walker and bedside commode.                  Action/Plan:   Expected Discharge Date:  12/21/14               Expected Discharge Plan:     In-House Referral:     Discharge planning Services  CM Consult  Post Acute Care Choice:  Durable Medical Equipment, Home Health Choice offered to:  Patient  DME Arranged:  Walker rolling, Bedside commode, Shower stool DME Agency:  [facility]  HH Arranged:  PT [facility] Agency:     Status of Service:  In process, will continue to follow  Medicare Important Message Given:    Date Medicare IM Given:    Medicare IM give by:    Date Additional Medicare IM Given:    Additional Medicare Important Message give by:     If discussed at Long Length of Stay Meetings, dates discussed:    Additional Comments:  Ilay Capshaw A, RN 12/21/2014, 9:19 AM

## 2014-12-22 LAB — SURGICAL PATHOLOGY

## 2014-12-24 NOTE — Addendum Note (Signed)
Addendum  created 12/24/14 [id number] by Rosaria FerriesJoseph K Gwendalynn Eckstrom, MD   Modules edited: Anesthesia Attestations

## 2015-03-18 ENCOUNTER — Encounter: Payer: Self-pay | Admitting: Orthopedic Surgery

## 2016-05-27 ENCOUNTER — Other Ambulatory Visit: Payer: Self-pay | Admitting: Obstetrics and Gynecology

## 2016-05-27 DIAGNOSIS — N760 Acute vaginitis: Principal | ICD-10-CM

## 2016-05-27 DIAGNOSIS — B9689 Other specified bacterial agents as the cause of diseases classified elsewhere: Secondary | ICD-10-CM

## 2016-05-27 MED ORDER — METRONIDAZOLE 500 MG PO TABS: 500.0000 mg | ORAL_TABLET | Freq: Two times a day (BID) | ORAL | 0 refills | Status: AC

## 2016-05-27 NOTE — Telephone Encounter (Signed)
I sent in Rx RF.

## 2016-05-27 NOTE — Progress Notes (Unsigned)
la 

## 2016-05-30 NOTE — Telephone Encounter (Signed)
Called pt to make sure she was aware Flagyl was refilled.  She states/requests refill on xanax as well.

## 2016-05-31 NOTE — Telephone Encounter (Signed)
Left detailed msg for pt to sched. Annual before [facility] can refill xanax.

## 2016-05-31 NOTE — Telephone Encounter (Signed)
Pt is past due for annual and needs to schedule before she gets xanax RF. Thx.

## 2016-06-02 ENCOUNTER — Encounter: Payer: Self-pay | Admitting: Obstetrics and Gynecology

## 2016-06-02 NOTE — Telephone Encounter (Signed)
This encounter was created in error - please disregard.

## 2016-06-13 ENCOUNTER — Other Ambulatory Visit: Payer: Self-pay | Admitting: Obstetrics and Gynecology

## 2016-06-13 DIAGNOSIS — Z1231 Encounter for screening mammogram for malignant neoplasm of breast: Secondary | ICD-10-CM

## 2016-07-05 ENCOUNTER — Encounter: Payer: Self-pay | Admitting: Obstetrics and Gynecology

## 2016-07-05 ENCOUNTER — Ambulatory Visit (INDEPENDENT_AMBULATORY_CARE_PROVIDER_SITE_OTHER): Payer: BC Managed Care – PPO | Admitting: Obstetrics and Gynecology

## 2016-07-05 ENCOUNTER — Ambulatory Visit
Admission: RE | Admit: 2016-07-05 | Discharge: 2016-07-05 | Disposition: A | Discharge status: Home / Self Care | Payer: BC Managed Care – PPO | Source: Ambulatory Visit | Attending: Obstetrics and Gynecology | Admitting: Obstetrics and Gynecology

## 2016-07-05 VITALS — BP 142/92 | HR 77 | Ht 68.0 in | Wt 280.0 lb

## 2016-07-05 DIAGNOSIS — Z1231 Encounter for screening mammogram for malignant neoplasm of breast: Secondary | ICD-10-CM | POA: Insufficient documentation

## 2016-07-05 DIAGNOSIS — Z01419 Encounter for gynecological examination (general) (routine) without abnormal findings: Secondary | ICD-10-CM | POA: Diagnosis not present

## 2016-07-05 DIAGNOSIS — Z1151 Encounter for screening for human papillomavirus (HPV): Secondary | ICD-10-CM

## 2016-07-05 DIAGNOSIS — Z124 Encounter for screening for malignant neoplasm of cervix: Secondary | ICD-10-CM

## 2016-07-05 DIAGNOSIS — Z1239 Encounter for other screening for malignant neoplasm of breast: Secondary | ICD-10-CM

## 2016-07-05 DIAGNOSIS — F419 Anxiety disorder, unspecified: Secondary | ICD-10-CM

## 2016-07-05 MED ORDER — ALPRAZOLAM 0.25 MG PO TABS: ORAL_TABLET | ORAL | 1 refills | Status: DC

## 2016-07-05 NOTE — Progress Notes (Addendum)
Chief Complaint  Patient presents with  . Gynecologic Exam     HPI:      [name] is a 51 y.o. G1P1001 who LMP was No LMP recorded. Patient has had a hysterectomy., presents today for her annual examination.  Her menses are absent due to hyst. Dysmenorrhea none. She does not have intermenstrual bleeding.  Sex activity: single partner, contraception - status post hysterectomy.  Last Pap: May 22, 2015  Results were: no abnormalities /POS HPV DNA. Pt had colpo and repeat pap 06/04/15 that showed same results. Repeat pap 12/17 was negative with HPV DNA too. Hx of STDs: trichomonas, HPV  Last mammogram: May 22, 2015  Results were: normal--routine follow-up in 12 months There is a FH of breast cancer in her MGM, genetic testing not indicated. There is no FH of ovarian cancer. The patient does not do self-breast exams. Pt's dad had colon cancer age 37. Pt had colonoscopy 2016 that was normal. She is unsure when due again.  Tobacco use: The patient currently smokes 1/2 packs of cigarettes per day for the past many years. Alcohol use: none Exercise: not active  She does not get adequate calcium and Vitamin D in her diet.  She has a hx of anxiety. She uses xanax sparingly, about 2 times wkly. She needs RF. She is aware of addictive potential.   Past Medical History:  Diagnosis Date  . Anemia   . Anxiety   . Depression   . Headache     Past Surgical History:  Procedure Laterality Date  . ABDOMINAL HYSTERECTOMY    . CESAREAN SECTION    . CHOLECYSTECTOMY    . 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 Colon cancer Father 69  . Breast cancer Maternal Grandfather 11    Social History   Social History  . Marital status: Divorced    Spouse name: N/A  . Number of children:  N/A  . Years of education: N/A   Occupational History  . Not on file.   Social History Main Topics  . Smoking status: Current Every Day Smoker    Packs/day: 0.25    Types: Cigarettes  . Smokeless tobacco: Never Used  . Alcohol use Yes     Comment: occ  . Drug use: No  . Sexual activity: Yes    Birth control/ protection: Surgical   Other Topics Concern  . Not on file   Social History Narrative  . No narrative on file     Current Outpatient Prescriptions:  .  ALPRAZolam (XANAX) 0.25 MG tablet, TAKE 1 TABLET BY MOUTH UP TO 3 TIMES A DAY AS NEEDED FOR SYMPTOMS, Disp: 30 tablet, Rfl: 1  ROS:  Review of Systems  Constitutional: Negative for fever, malaise/fatigue and weight loss.  HENT: Negative for congestion, ear pain and sinus pain.   Respiratory: Negative for cough, shortness of breath and wheezing.   Cardiovascular: Negative for chest pain, orthopnea and leg swelling.  Gastrointestinal: Negative for constipation, diarrhea, nausea and vomiting.  Genitourinary: Negative for dysuria, frequency, hematuria and urgency.       Breast ROS: negative   Musculoskeletal: Negative for back pain, joint pain and myalgias.  Skin: Negative for itching and rash.  Neurological: Positive for headaches. Negative for dizziness, tingling and focal weakness.  Endo/Heme/Allergies: Negative for environmental allergies. Does not bruise/bleed easily.  Psychiatric/Behavioral: Negative for depression and suicidal ideas. The patient is nervous/anxious. The patient does not have insomnia.     Objective: BP (!) 142/92   Pulse 77   Ht  (1.727 m)   Wt 280 lb (127 kg)   BMI 42.57 kg/m    Physical Exam  Constitutional: She is oriented to person, place, and time. She appears well-developed and well-nourished.  Genitourinary: Vagina normal. No erythema or tenderness in the vagina. No vaginal discharge found. Right adnexum does not display mass and does not display tenderness. Left adnexum does  not display mass and does not display tenderness.  Genitourinary Comments: Uterus and cx surg rem  Neck: Normal range of motion. No thyromegaly present.  Cardiovascular: Normal rate, regular rhythm and normal heart sounds.   No murmur heard. Pulmonary/Chest: Effort normal and breath sounds normal. Right breast exhibits no mass, no nipple discharge, no skin change and no tenderness. Left breast exhibits no mass, no nipple discharge, no skin change and no tenderness.  Abdominal: Soft. There is no tenderness. There is no guarding.  Musculoskeletal: Normal range of motion.  Neurological: She is alert and oriented to person, place, and time. No cranial nerve deficit.  Psychiatric: She has a normal mood and affect. Her behavior is normal.  Vitals reviewed.   Assessment/Plan: Encounter for annual routine gynecological examination  Cervical cancer screening - Abn pap smear last yr. Will call pt with results. - Plan: IGP, Aptima HPV  Screening for HPV (human papillomavirus) - Plan: IGP, Aptima HPV  Screening for breast cancer - Pt has mammo today.  Anxiety - Pt takes xanax sparingly. Rx RF faxed. F/u pnr. Pt aware of addictive potential.  - Plan: ALPRAZolam (XANAX) 0.25 MG tablet              GYN counsel mammography screening, menopause, adequate intake of calcium and vitamin D, diet and exercise     F/U  Return in about 1 year (around 07/05/2017).  Alicia B. Copland, PA-C 07/05/2016 3:52 PM

## 2016-07-08 LAB — IGP, APTIMA HPV
HPV APTIMA: NEGATIVE
PAP SMEAR COMMENT: 0

## 2016-07-10 ENCOUNTER — Encounter: Payer: Self-pay | Admitting: Obstetrics and Gynecology

## 2016-07-14 ENCOUNTER — Other Ambulatory Visit: Payer: Self-pay | Admitting: *Deleted

## 2016-07-14 ENCOUNTER — Encounter: Payer: Self-pay | Admitting: Obstetrics and Gynecology

## 2016-07-14 ENCOUNTER — Inpatient Hospital Stay
Admission: RE | Admit: 2016-07-14 | Discharge: 2016-07-14 | Disposition: A | Discharge status: Home / Self Care | Payer: Self-pay | Source: Ambulatory Visit | Attending: *Deleted | Admitting: *Deleted

## 2016-07-14 DIAGNOSIS — Z9289 Personal history of other medical treatment: Secondary | ICD-10-CM

## 2017-06-19 ENCOUNTER — Other Ambulatory Visit: Payer: Self-pay | Admitting: Obstetrics and Gynecology

## 2017-07-12 ENCOUNTER — Ambulatory Visit: Payer: BC Managed Care – PPO | Admitting: Obstetrics and Gynecology

## 2017-07-20 ENCOUNTER — Ambulatory Visit (INDEPENDENT_AMBULATORY_CARE_PROVIDER_SITE_OTHER): Payer: BC Managed Care – PPO | Admitting: Obstetrics and Gynecology

## 2017-07-20 ENCOUNTER — Ambulatory Visit
Admission: RE | Admit: 2017-07-20 | Discharge: 2017-07-20 | Disposition: A | Discharge status: Home / Self Care | Payer: BC Managed Care – PPO | Source: Ambulatory Visit | Attending: Obstetrics and Gynecology | Admitting: Obstetrics and Gynecology

## 2017-07-20 ENCOUNTER — Encounter: Payer: Self-pay | Admitting: Obstetrics and Gynecology

## 2017-07-20 VITALS — BP 140/80 | Ht 68.0 in | Wt 272.0 lb

## 2017-07-20 DIAGNOSIS — Z1151 Encounter for screening for human papillomavirus (HPV): Secondary | ICD-10-CM | POA: Diagnosis not present

## 2017-07-20 DIAGNOSIS — Z01419 Encounter for gynecological examination (general) (routine) without abnormal findings: Secondary | ICD-10-CM

## 2017-07-20 DIAGNOSIS — K6289 Other specified diseases of anus and rectum: Secondary | ICD-10-CM

## 2017-07-20 DIAGNOSIS — F419 Anxiety disorder, unspecified: Secondary | ICD-10-CM

## 2017-07-20 DIAGNOSIS — Z1231 Encounter for screening mammogram for malignant neoplasm of breast: Secondary | ICD-10-CM

## 2017-07-20 DIAGNOSIS — Z1239 Encounter for other screening for malignant neoplasm of breast: Secondary | ICD-10-CM

## 2017-07-20 DIAGNOSIS — Z124 Encounter for screening for malignant neoplasm of cervix: Secondary | ICD-10-CM | POA: Diagnosis not present

## 2017-07-20 MED ORDER — ALPRAZOLAM 0.25 MG PO TABS: 0.2500 mg | ORAL_TABLET | Freq: Two times a day (BID) | ORAL | 0 refills | Status: DC | PRN

## 2017-07-20 NOTE — Progress Notes (Signed)
Chief Complaint  Patient presents with  . Gynecologic Exam     HPI:      [name] is a 52 y.o. G1P1001 who LMP was No LMP recorded. Patient has had a hysterectomy., presents today for her annual examination. Her menses are absent due to hyst. Dysmenorrhea none. She does not have intermenstrual bleeding.  Sex activity: not sex active.  Last Pap: 07/05/16  Results were: no abnormalities /neg HPV DNA. Pt had colpo and repeat pap 06/04/15 that showed neg pap/POS HPV DNA. Repeat pap 12/17 was negative with HPV DNA, too. Hx of STDs: trichomonas, HPV on pap  Last mammogram: 07/05/16  Results were: normal--routine follow-up in 12 months  There is a FH of breast cancer in her MGM, genetic testing not indicated. There is no FH of ovarian cancer. The patient does not do self-breast exams.  Pt's dad had colon cancer age 73. Pt had colonoscopy 2016 that was normal. She is due again in 5 yrs.  Tobacco use: The patient currently smokes 1/2 packs of cigarettes per day for the past many years. Alcohol use: none Exercise: not active  She does get adequate calcium and Vitamin D in her diet. Labs with PCP.  She has a hx of anxiety. She uses xanax sparingly, about 2 times wkly. She needs RF. She is aware of addictive potential.   She has had issues with rectal cramping/soreness, not related to BM. Hx of constipation but having increased BMs this wk due to increased fiber. No bleeding. Sx started after she started drinking coffee.    Past Medical History:  Diagnosis Date  . Anemia   . Anxiety   . Depression   . Headache     Past Surgical History:  Procedure Laterality Date  . ABDOMINAL HYSTERECTOMY    . CESAREAN SECTION    . CHOLECYSTECTOMY    . TOTAL HIP ARTHROPLASTY Left 12/18/2014   Procedure: TOTAL HIP ARTHROPLASTY ANTERIOR APPROACH;  Surgeon: Kennedy Bucker, MD;  Location: [facility] ORS;  Service: Orthopedics;  Laterality: Left;    Family History  Problem Relation Age of  Onset  . Colon cancer Father 22  . Breast cancer Maternal Grandfather 89    Social History   Socioeconomic History  . Marital status: Divorced    Spouse name: Not on file  . Number of children: Not on file  . Years of education: Not on file  . Highest education level: Not on file  Occupational History  . Not on file  Social Needs  . Financial resource strain: Not on file  . Food insecurity:    Worry: Not on file    Inability: Not on file  . Transportation needs:    Medical: Not on file    Non-medical: Not on file  Tobacco Use  . Smoking status: Current Every Day Smoker    Packs/day: 0.25    Types: Cigarettes  . Smokeless tobacco: Never Used  Substance and Sexual Activity  . Alcohol use: Yes    Comment: occ  . Drug use: No  . Sexual activity: Yes    Birth control/protection: Surgical  Lifestyle  . Physical activity:    Days per week: Not on file    Minutes per session: Not on file  . Stress: Not on file  Relationships  . Social connections:    Talks on phone: Not on file    Gets together: Not on file    Attends religious service: Not on file  Active member of club or organization: Not on file    Attends meetings of clubs or organizations: Not on file    Relationship status: Not on file  . Intimate partner violence:    Fear of current or ex partner: Not on file    Emotionally abused: Not on file    Physically abused: Not on file    Forced sexual activity: Not on file  Other Topics Concern  . Not on file  Social History Narrative  . Not on file    No current outpatient medications on file prior to visit.   No current facility-administered medications on file prior to visit.       ROS:  Review of Systems  Constitutional: Negative for fatigue, fever and unexpected weight change.  Respiratory: Negative for cough, shortness of breath and wheezing.   Cardiovascular: Negative for chest pain, palpitations and leg swelling.  Gastrointestinal: Positive for  rectal pain. Negative for blood in stool, constipation, diarrhea, nausea and vomiting.  Endocrine: Negative for cold intolerance, heat intolerance and polyuria.  Genitourinary: Negative for dyspareunia, dysuria, flank pain, frequency, genital sores, hematuria, menstrual problem, pelvic pain, urgency, vaginal bleeding, vaginal discharge and vaginal pain.  Musculoskeletal: Negative for back pain, joint swelling and myalgias.  Skin: Negative for rash.  Neurological: Negative for dizziness, syncope, light-headedness, numbness and headaches.  Hematological: Negative for adenopathy.  Psychiatric/Behavioral: Negative for agitation, confusion, sleep disturbance and suicidal ideas. The patient is not nervous/anxious.     Objective: BP 140/80   Ht  (1.727 m)   Wt 272 lb (123.4 kg)   BMI 41.36 kg/m    Physical Exam  Constitutional: She is oriented to person, place, and time. She appears well-developed and well-nourished.  Genitourinary: Vagina normal and uterus normal. There is no rash or tenderness on the right labia. There is no rash or tenderness on the left labia. No erythema or tenderness in the vagina. No vaginal discharge found. Right adnexum does not display mass and does not display tenderness. Left adnexum does not display mass and does not display tenderness. Cervix does not exhibit motion tenderness or polyp. Uterus is not enlarged or tender. Rectal exam shows no external hemorrhoid and no fissure.  Neck: Normal range of motion. No thyromegaly present.  Cardiovascular: Normal rate, regular rhythm and normal heart sounds.  No murmur heard. Pulmonary/Chest: Effort normal and breath sounds normal. Right breast exhibits no mass, no nipple discharge, no skin change and no tenderness. Left breast exhibits no mass, no nipple discharge, no skin change and no tenderness.  Abdominal: Soft. There is no tenderness. There is no guarding.  Musculoskeletal: Normal range of motion.  Neurological:  She is alert and oriented to person, place, and time. No cranial nerve deficit.  Psychiatric: She has a normal mood and affect. Her behavior is normal.  Vitals reviewed.   Assessment/Plan: Encounter for annual routine gynecological examination  Screening for breast cancer - Pt sched for mammo later today. - Plan: MM 3D SCREEN BREAST BILATERAL  Cervical cancer screening - Hx of abn. Will f/u if abn today. Neg last yr. - Plan: IGP, Aptima HPV  Screening for HPV (human papillomavirus) - Plan: IGP, Aptima HPV  Rectal pain - Question spasm given sx hx. D/C caffeine. F/u prn. Can try hem supp if sx persist, but sx don't sound like hemorrhoids.   Anxiety - Pt takes xanax sparingly. Rx RF faxed. F/u prn. Pt aware of addictive potential.  - Plan: ALPRAZolam (XANAX) 0.25 MG tablet  Meds ordered this encounter  Medications  . ALPRAZolam (XANAX) 0.25 MG tablet    Sig: Take 1 tablet (0.25 mg total) by mouth 2 (two) times daily as needed for anxiety.    Dispense:  30 tablet    Refill:  0    Order Specific Question:   Supervising Provider    Answer:   Nadara Mustard [[id number]]             GYN counsel breast self exam, mammography screening, adequate intake of calcium and vitamin D, diet and exercise     F/U  Return in about 1 year (around 07/21/2018).  Alicia B. Copland, PA-C 07/20/2017 3:30 PM

## 2017-07-20 NOTE — Patient Instructions (Signed)
I value your feedback and entrusting us with your care. If you get a [facility] patient survey, I would appreciate you taking the time to let us know about your experience today. Thank you! 

## 2017-07-22 ENCOUNTER — Encounter: Payer: Self-pay | Admitting: Obstetrics and Gynecology

## 2017-07-23 LAB — IGP, APTIMA HPV
HPV APTIMA: POSITIVE — AB
PAP SMEAR COMMENT: 0

## 2017-07-24 ENCOUNTER — Telehealth: Payer: Self-pay | Admitting: Obstetrics and Gynecology

## 2017-07-24 NOTE — Telephone Encounter (Signed)
Patient is calling for labs results. Please advise. 

## 2017-07-24 NOTE — Telephone Encounter (Signed)
Done

## 2018-07-25 ENCOUNTER — Other Ambulatory Visit: Payer: Self-pay | Admitting: Obstetrics and Gynecology

## 2018-07-25 DIAGNOSIS — Z1231 Encounter for screening mammogram for malignant neoplasm of breast: Secondary | ICD-10-CM

## 2018-08-20 ENCOUNTER — Other Ambulatory Visit (HOSPITAL_COMMUNITY)
Admission: RE | Admit: 2018-08-20 | Discharge: 2018-08-20 | Disposition: A | Discharge status: Home / Self Care | Payer: BC Managed Care – PPO | Source: Ambulatory Visit | Attending: Obstetrics and Gynecology | Admitting: Obstetrics and Gynecology

## 2018-08-20 ENCOUNTER — Encounter: Payer: Self-pay | Admitting: Obstetrics and Gynecology

## 2018-08-20 ENCOUNTER — Other Ambulatory Visit: Payer: Self-pay

## 2018-08-20 ENCOUNTER — Ambulatory Visit (INDEPENDENT_AMBULATORY_CARE_PROVIDER_SITE_OTHER): Payer: BC Managed Care – PPO | Admitting: Obstetrics and Gynecology

## 2018-08-20 ENCOUNTER — Ambulatory Visit
Admission: RE | Admit: 2018-08-20 | Discharge: 2018-08-20 | Disposition: A | Discharge status: Home / Self Care | Payer: BC Managed Care – PPO | Source: Ambulatory Visit | Attending: Obstetrics and Gynecology | Admitting: Obstetrics and Gynecology

## 2018-08-20 VITALS — BP 130/80 | Ht 68.0 in | Wt 281.2 lb

## 2018-08-20 DIAGNOSIS — R87810 Cervical high risk human papillomavirus (HPV) DNA test positive: Secondary | ICD-10-CM | POA: Diagnosis present

## 2018-08-20 DIAGNOSIS — Z124 Encounter for screening for malignant neoplasm of cervix: Secondary | ICD-10-CM

## 2018-08-20 DIAGNOSIS — F419 Anxiety disorder, unspecified: Secondary | ICD-10-CM

## 2018-08-20 DIAGNOSIS — Z1151 Encounter for screening for human papillomavirus (HPV): Secondary | ICD-10-CM | POA: Insufficient documentation

## 2018-08-20 DIAGNOSIS — Z1231 Encounter for screening mammogram for malignant neoplasm of breast: Secondary | ICD-10-CM | POA: Insufficient documentation

## 2018-08-20 DIAGNOSIS — Z1239 Encounter for other screening for malignant neoplasm of breast: Secondary | ICD-10-CM

## 2018-08-20 DIAGNOSIS — Z01419 Encounter for gynecological examination (general) (routine) without abnormal findings: Secondary | ICD-10-CM

## 2018-08-20 MED ORDER — ALPRAZOLAM 0.5 MG PO TABS: 0.5000 mg | ORAL_TABLET | Freq: Two times a day (BID) | ORAL | 0 refills | Status: DC | PRN

## 2018-08-20 NOTE — Progress Notes (Signed)
Chief Complaint  Patient presents with  . Gynecologic Exam     HPI:      [name] is a 53 y.o. G1P1001 who LMP was No LMP recorded. Patient has had a hysterectomy., presents today for her annual examination. Her menses are absent due to hyst. Dysmenorrhea none. She does not have intermenstrual bleeding. No vasomotor sx.  Sex activity: currently sex active, contraception--hyst. No vaginal dryness.  Last Pap: 07/20/17  Results were: no abnormalities /POS HPV DNA. Hx of HPV DNA on paps, repeat due to today. Pt had colpo and repeat pap 06/04/15 that showed neg pap/POS HPV DNA. Repeat pap 12/17 and 4/18 were negative with HPV DNA, too. Hx of STDs: trichomonas, HPV on pap  Last mammogram: today.  Results were: normal--routine follow-up in 12 months There is a FH of breast cancer in her MGM, genetic testing not indicated. There is no FH of ovarian cancer. The patient does not do self-breast exams.  Pt's dad had colon cancer age 51. Pt had colonoscopy 2016 that was normal. She is due again in 5 yrs.  Tobacco use: The patient currently smokes 1/2 packs of cigarettes per day for the past many years. Not ready to quit. Alcohol use: none Exercise: mod active  She does get adequate calcium and Vitamin D in her diet. Labs with PCP.  She has a hx of anxiety. She uses xanax 0.25 mg sparingly, about 2 times wkly. She would prefer the 0.5 mg dose (had it in past, worked better). She needs Rx. She is aware of addictive potential.   Past Medical History:  Diagnosis Date  . Anemia   . Anxiety   . Cervical high risk human papillomavirus (HPV) DNA test positive   . Depression   . Family history of colon cancer in father   . Headache     Past Surgical History:  Procedure Laterality Date  . ABDOMINAL HYSTERECTOMY    . CESAREAN SECTION    . CHOLECYSTECTOMY    . COLONOSCOPY  2016   repeat in 5 yrs  . TOTAL HIP ARTHROPLASTY Left 12/18/2014   Procedure: TOTAL HIP ARTHROPLASTY  ANTERIOR APPROACH;  Surgeon: Kennedy BuckerMichael Menz, MD;  Location: [facility] ORS;  Service: Orthopedics;  Laterality: Left;    Family History  Problem Relation Age of Onset  . Colon cancer Father [id number]  . Breast cancer Maternal Grandmother 2455  . Ovarian cancer Neg Hx     Social History   Socioeconomic History  . Marital status: Divorced    Spouse name: Not on file  . Number of children: Not on file  . Years of education: Not on file  . Highest education level: Not on file  Occupational History  . Not on file  Social Needs  . Financial resource strain: Not on file  . Food insecurity:    Worry: Not on file    Inability: Not on file  . Transportation needs:    Medical: Not on file    Non-medical: Not on file  Tobacco Use  . Smoking status: Current Every Day Smoker    Packs/day: 0.25    Types: Cigarettes  . Smokeless tobacco: Never Used  Substance and Sexual Activity  . Alcohol use: Not Currently    Comment: occ  . Drug use: No  . Sexual activity: Yes    Birth control/protection: Surgical    Comment: Hysterectomy  Lifestyle  . Physical activity:    Days per week: Not on file  Minutes per session: Not on file  . Stress: Not on file  Relationships  . Social connections:    Talks on phone: Not on file    Gets together: Not on file    Attends religious service: Not on file    Active member of club or organization: Not on file    Attends meetings of clubs or organizations: Not on file    Relationship status: Not on file  . Intimate partner violence:    Fear of current or ex partner: Not on file    Emotionally abused: Not on file    Physically abused: Not on file    Forced sexual activity: Not on file  Other Topics Concern  . Not on file  Social History Narrative  . Not on file    No current outpatient medications on file prior to visit.   No current facility-administered medications on file prior to visit.       ROS:  Review of Systems  Constitutional: Negative for  fatigue, fever and unexpected weight change.  Respiratory: Negative for cough, shortness of breath and wheezing.   Cardiovascular: Negative for chest pain, palpitations and leg swelling.  Gastrointestinal: Negative for blood in stool, constipation, diarrhea, nausea, rectal pain and vomiting.  Endocrine: Negative for cold intolerance, heat intolerance and polyuria.  Genitourinary: Negative for dyspareunia, dysuria, flank pain, frequency, genital sores, hematuria, menstrual problem, pelvic pain, urgency, vaginal bleeding, vaginal discharge and vaginal pain.  Musculoskeletal: Negative for back pain, joint swelling and myalgias.  Skin: Negative for rash.  Neurological: Negative for dizziness, syncope, light-headedness, numbness and headaches.  Hematological: Negative for adenopathy.  Psychiatric/Behavioral: Negative for agitation, confusion, sleep disturbance and suicidal ideas. The patient is not nervous/anxious.     Objective: BP 130/80   Ht 5\' 8"  (1.727 m)   Wt 281 lb 3.2 oz (127.6 kg)   BMI 42.76 kg/m    Physical Exam Constitutional:      Appearance: She is well-developed.  Genitourinary:     Vulva, vagina, right adnexa and left adnexa normal.     No vaginal discharge, erythema or tenderness.     Cervix is absent.     Uterus is absent.     No right or left adnexal mass present.     Right adnexa not tender.     Left adnexa not tender.     Genitourinary Comments: UTERUS/CX SURG REM  Neck:     Musculoskeletal: Normal range of motion.     Thyroid: No thyromegaly.  Cardiovascular:     Rate and Rhythm: Normal rate and regular rhythm.     Heart sounds: Normal heart sounds. No murmur.  Pulmonary:     Effort: Pulmonary effort is normal.     Breath sounds: Normal breath sounds.  Chest:     Breasts:        Right: No mass, nipple discharge, skin change or tenderness.        Left: No mass, nipple discharge, skin change or tenderness.  Abdominal:     Palpations: Abdomen is soft.      Tenderness: There is no abdominal tenderness. There is no guarding.  Musculoskeletal: Normal range of motion.  Neurological:     General: No focal deficit present.     Mental Status: She is alert and oriented to person, place, and time.     Cranial Nerves: No cranial nerve deficit.  Skin:    General: Skin is warm and dry.  Psychiatric:  Mood and Affect: Mood normal.        Behavior: Behavior normal.        Thought Content: Thought content normal.        Judgment: Judgment normal.  Vitals signs reviewed.     Assessment/Plan: Encounter for annual routine gynecological examination  Cervical cancer screening - Plan: Cytology - PAP  Screening for HPV (human papillomavirus) - Plan: Cytology - PAP  Cervical high risk human papillomavirus (HPV) DNA test positive - Repeat pap today. Will call with results.  - Plan: Cytology - PAP  Screening for breast cancer - Pt had mammo today.  Anxiety - Rx RF xanax. Increase to 0.5 mg. Pt takes sparingly.  - Plan: ALPRAZolam (XANAX) 0.5 MG tablet  Meds ordered this encounter  Medications  . ALPRAZolam (XANAX) 0.5 MG tablet    Sig: Take 1 tablet (0.5 mg total) by mouth 2 (two) times daily as needed for anxiety.    Dispense:  30 tablet    Refill:  0    Order Specific Question:   Supervising Provider    Answer:   Nadara MustardHARRIS, ROBERT P [[id number]]             GYN counsel breast self exam, mammography screening, adequate intake of calcium and vitamin D, diet and exercise     F/U  Return in about 1 year (around 08/20/2019).  Syncere Eble B. Charika Mikelson, PA-C 08/20/2018 4:23 PM

## 2018-08-20 NOTE — Patient Instructions (Signed)
I value your feedback and entrusting us with your care. If you get a [place] patient survey, I would appreciate you taking the time to let us know about your experience today. Thank you! 

## 2018-08-23 LAB — CYTOLOGY - PAP
Diagnosis: NEGATIVE
HPV: DETECTED — AB

## 2018-09-18 ENCOUNTER — Encounter: Payer: Self-pay | Admitting: Obstetrics & Gynecology

## 2018-09-18 ENCOUNTER — Ambulatory Visit (INDEPENDENT_AMBULATORY_CARE_PROVIDER_SITE_OTHER): Payer: BC Managed Care – PPO | Admitting: Obstetrics & Gynecology

## 2018-09-18 ENCOUNTER — Other Ambulatory Visit: Payer: Self-pay

## 2018-09-18 ENCOUNTER — Other Ambulatory Visit (HOSPITAL_COMMUNITY)
Admission: RE | Admit: 2018-09-18 | Discharge: 2018-09-18 | Disposition: A | Discharge status: Home / Self Care | Payer: BC Managed Care – PPO | Source: Ambulatory Visit | Attending: Obstetrics & Gynecology | Admitting: Obstetrics & Gynecology

## 2018-09-18 VITALS — BP 130/90 | Ht 68.0 in | Wt 280.0 lb

## 2018-09-18 DIAGNOSIS — Z90710 Acquired absence of both cervix and uterus: Secondary | ICD-10-CM | POA: Insufficient documentation

## 2018-09-18 DIAGNOSIS — R87810 Cervical high risk human papillomavirus (HPV) DNA test positive: Secondary | ICD-10-CM

## 2018-09-18 NOTE — Patient Instructions (Signed)

## 2018-09-18 NOTE — Addendum Note (Signed)
Addended by: Gae Dry on: 09/18/2018 04:14 PM   Modules accepted: Orders

## 2018-09-18 NOTE — Progress Notes (Signed)
HPI:  Kimberly Nixon is a 53 y.o.  G1P1001  who presents today for evaluation and management of abnormal vaginal cytology.   Prior hysterectomy Dysplasia History:  HPV x 2 occasions over the last year (prior year, neg).  ROS:  Pertinent items are noted in HPI.  OB History  Gravida Para Term Preterm AB Living  1 1 1     1   SAB TAB Ectopic Multiple Live Births          1    # Outcome Date GA Lbr Len/2nd Weight Sex Delivery Anes PTL Lv  1 Term 09/26/95    Kimberly Nixon   LIV    Past Medical History:  Diagnosis Date  . Anemia   . Anxiety   . Cervical high risk human papillomavirus (HPV) DNA test positive   . Depression   . Family history of colon cancer in father   . Headache     Past Surgical History:  Procedure Laterality Date  . ABDOMINAL HYSTERECTOMY    . CESAREAN SECTION    . CHOLECYSTECTOMY    . COLONOSCOPY  2016   repeat in 5 yrs  . TOTAL HIP ARTHROPLASTY Left 12/18/2014   Procedure: TOTAL HIP ARTHROPLASTY ANTERIOR APPROACH;  Surgeon: Kimberly Knows, MD;  Location: [facility] ORS;  Service: Orthopedics;  Laterality: Left;    SOCIAL HISTORY: Social History   Substance and Sexual Activity  Alcohol Use Not Currently   Comment: occ   Social History   Substance and Sexual Activity  Drug Use No     Family History  Problem Relation Age of Onset  . Colon cancer Father 82  . Breast cancer Maternal Grandmother 91  . Ovarian cancer Neg Hx     ALLERGIES:  Patient has no known allergies.  Current Outpatient Medications on File Prior to Visit  Medication Sig Dispense Refill  . ALPRAZolam (XANAX) 0.5 MG tablet Take 1 tablet (0.5 mg total) by mouth 2 (two) times daily as needed for anxiety. 30 tablet 0   No current facility-administered medications on file prior to visit.     Physical Exam: -Vitals:  BP 130/90   Ht 5\' 8"  (1.727 m)   Wt 280 lb (127 kg)   BMI 42.57 kg/m  GEN: WD, WN, NAD.  A+ O x 3, good mood and affect. ABD:  NT, ND.  Soft, no masses.  No hernias  noted.   Pelvic:   Vulva: Normal appearance.  No lesions.  Vagina: No lesions or abnormalities noted.  Support: Normal pelvic support.  Urethra No masses tenderness or scarring.  Meatus Normal size without lesions or prolapse.  Cervix: Absent  Anus: Normal exam.  No lesions.  Perineum: Normal exam.  No lesions.        Bimanual   Uterus: Absent  Adnexae: No masses.  Non-tender to palpation.  Cul-de-sac: Negative for abnormality.   PROCEDURE: 1.  Urine Pregnancy Test:  not done 2.  Colposcopy performed with 4% acetic acid after verbal consent obtained                                         -Aceto-white Lesions Location(s): none; small polyp like area at apex of vagina              -Biopsy performed at this spot               -ECC  indicated and performed: No.     -Biopsy sites made hemostatic with pressure, AgNO3, and/or Monsel's solution   -Satisfactory colposcopy: Yes.      -Evidence of Invasive cervical CA :  NO  ASSESSMENT:  Kimberly Nixon is a 53 y.o. G1P1001 here for  1. 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 History of hysterectomy   .  PLAN: 1.  I discussed the grading system of pap smears and HPV high risk viral types.  We will discuss and base management after colpo results return. 2. Follow up PAP 6 months, vs intervention if high grade dysplasia identified     Kimberly Nixon , MD, Merlinda FrederickFACOG [facility], [facility] 09/18/2018  3:44 PM

## 2018-09-24 NOTE — Progress Notes (Signed)
Biopsy Diagnosis Vagina, biopsy - BENIGN SQUAMOUS EPITHELIUM WITH MILD CHRONIC INFLAMMATION.  Reassured  Barnett Applebaum, MD, [facility], [facility] 09/24/2018  9:50 AM

## 2019-05-12 ENCOUNTER — Ambulatory Visit: Payer: BC Managed Care – PPO | Attending: Internal Medicine

## 2019-05-12 DIAGNOSIS — Z23 Encounter for immunization: Secondary | ICD-10-CM

## 2019-05-12 NOTE — Progress Notes (Signed)
   Covid-19 Vaccination Clinic  Name:  Kimberly Nixon    MRN: [id number] DOB: 01-28-1966  05/12/2019  Kimberly Nixon was observed post Covid-19 immunization for 15 minutes without incidence. She was provided with Vaccine Information Sheet and instruction to access the V-Safe system.   Kimberly Nixon was instructed to call 911 with any severe reactions post vaccine: Marland Kitchen Difficulty breathing  . Swelling of your face and throat  . A fast heartbeat  . A bad rash all over your body  . Dizziness and weakness    Immunizations Administered    Name Date Dose VIS Date Route   Pfizer COVID-19 Vaccine 05/12/2019  9:21 AM 0.3 mL 02/22/2019 Intramuscular   Manufacturer: [employer], [employer]   Lot: [id number]   NDC: [address]-1

## 2019-06-04 ENCOUNTER — Ambulatory Visit: Payer: BC Managed Care – PPO | Attending: Internal Medicine

## 2019-06-04 DIAGNOSIS — Z23 Encounter for immunization: Secondary | ICD-10-CM

## 2019-06-04 NOTE — Progress Notes (Signed)
   Covid-19 Vaccination Clinic  Name:  Kimberly Nixon    MRN: [id number] DOB: 31-May-1965  06/04/2019  [name] was observed post Covid-19 immunization for 15 minutes without incident. She was provided with Vaccine Information Sheet and instruction to access the V-Safe system.   [name] was instructed to call 911 with any severe reactions post vaccine: Marland Kitchen Difficulty breathing  . Swelling of face and throat  . A fast heartbeat  . A bad rash all over body  . Dizziness and weakness   Immunizations Administered    Name Date Dose VIS Date Route   Pfizer COVID-19 Vaccine 06/04/2019  1:55 PM 0.3 mL 02/22/2019 Intramuscular   Manufacturer: [employer], [employer]   Lot: [id number]   NDC: [address]-4

## 2019-08-01 ENCOUNTER — Other Ambulatory Visit: Payer: Self-pay | Admitting: Obstetrics and Gynecology

## 2019-08-01 DIAGNOSIS — Z1231 Encounter for screening mammogram for malignant neoplasm of breast: Secondary | ICD-10-CM

## 2019-08-21 ENCOUNTER — Ambulatory Visit (INDEPENDENT_AMBULATORY_CARE_PROVIDER_SITE_OTHER): Payer: BC Managed Care – PPO | Admitting: Obstetrics and Gynecology

## 2019-08-21 ENCOUNTER — Other Ambulatory Visit: Payer: Self-pay

## 2019-08-21 ENCOUNTER — Ambulatory Visit
Admission: RE | Admit: 2019-08-21 | Discharge: 2019-08-21 | Disposition: A | Discharge status: Home / Self Care | Payer: BC Managed Care – PPO | Source: Ambulatory Visit | Attending: Obstetrics and Gynecology | Admitting: Obstetrics and Gynecology

## 2019-08-21 ENCOUNTER — Other Ambulatory Visit (HOSPITAL_COMMUNITY)
Admission: RE | Admit: 2019-08-21 | Discharge: 2019-08-21 | Disposition: A | Discharge status: Home / Self Care | Payer: BC Managed Care – PPO | Source: Ambulatory Visit | Attending: Obstetrics and Gynecology | Admitting: Obstetrics and Gynecology

## 2019-08-21 ENCOUNTER — Encounter: Payer: Self-pay | Admitting: Obstetrics and Gynecology

## 2019-08-21 VITALS — BP 120/80 | Ht 68.0 in | Wt 277.0 lb

## 2019-08-21 DIAGNOSIS — R87810 Cervical high risk human papillomavirus (HPV) DNA test positive: Secondary | ICD-10-CM

## 2019-08-21 DIAGNOSIS — Z124 Encounter for screening for malignant neoplasm of cervix: Secondary | ICD-10-CM

## 2019-08-21 DIAGNOSIS — Z1151 Encounter for screening for human papillomavirus (HPV): Secondary | ICD-10-CM | POA: Diagnosis present

## 2019-08-21 DIAGNOSIS — Z01419 Encounter for gynecological examination (general) (routine) without abnormal findings: Secondary | ICD-10-CM

## 2019-08-21 DIAGNOSIS — F419 Anxiety disorder, unspecified: Secondary | ICD-10-CM | POA: Insufficient documentation

## 2019-08-21 DIAGNOSIS — Z1231 Encounter for screening mammogram for malignant neoplasm of breast: Secondary | ICD-10-CM | POA: Diagnosis not present

## 2019-08-21 MED ORDER — ALPRAZOLAM 0.5 MG PO TABS: 0.5000 mg | ORAL_TABLET | Freq: Two times a day (BID) | ORAL | 0 refills | Status: DC | PRN

## 2019-08-21 NOTE — Patient Instructions (Signed)
I value your feedback and entrusting us with your care. If you get a [place] patient survey, I would appreciate you taking the time to let us know about your experience today. Thank you!  As of February 21, 2019, your lab results will be released to your MyChart immediately, before I even have a chance to see them. Please give me time to review them and contact you if there are any abnormalities. Thank you for your patience.  

## 2019-08-21 NOTE — Progress Notes (Signed)
Chief Complaint  Patient presents with  . Gynecologic Exam     HPI:      [name] is a 54 y.o. G1P1001 who LMP was No LMP recorded. Patient has had a hysterectomy., presents today for her annual examination. Her menses are absent due to hyst. Dysmenorrhea none. She does not have intermenstrual bleeding. No vasomotor sx.  Sex activity: currently sex active, contraception--hyst. No vaginal dryness.   Last Pap: 08/20/18  Results were: no abnormalities /POS HPV DNA, same as 2019. Had colpo/bx of vagina with neg path, with Dr. Tiburcio Pea 7/20. 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 Pt had colpo 06/04/15. Hx of STDs: trichomonas, HPV on pap  Last mammogram: 08/20/18  Results were: normal--routine follow-up in 12 months; has appt today There is a FH of breast cancer in her MGM, genetic testing not indicated. There is no FH of ovarian cancer. The patient does not do self-breast exams.  Pt's dad had colon cancer age 64. Pt had colonoscopy 2016 that was normal. She is due again after 5 yrs, [facility]. Will call to sched appt  Tobacco use: The patient currently smokes 1/2 packs of cigarettes per day for the past many years. Not ready to quit. Alcohol use: none No drug use Exercise: mod active  She does get adequate calcium but not Vitamin D in her diet. Labs with PCP.  She has a hx of anxiety. She uses xanax 0.25 mg sparingly, about 2 times wkly. She would prefer the 0.5 mg dose (had it in past, worked better). She needs Rx. She is aware of addictive potential.   Past Medical History:  Diagnosis Date  . Anemia   . Anxiety   . Cervical high risk human papillomavirus (HPV) DNA test positive   . Depression   . Family history of colon cancer in father   . Headache     Past Surgical History:  Procedure Laterality Date  . ABDOMINAL HYSTERECTOMY    . CESAREAN SECTION    . CHOLECYSTECTOMY    . COLONOSCOPY  2016   repeat in 5 yrs  . TOTAL HIP ARTHROPLASTY Left  12/18/2014   Procedure: TOTAL HIP ARTHROPLASTY ANTERIOR APPROACH;  Surgeon: Kennedy Bucker, MD;  Location: [facility] ORS;  Service: Orthopedics;  Laterality: Left;    Family History  Problem Relation Age of Onset  . Colon cancer Father 24  . Breast cancer Maternal Grandmother 54  . Ovarian cancer Neg Hx     Social History   Socioeconomic History  . Marital status: Divorced    Spouse name: Not on file  . Number of children: Not on file  . Years of education: Not on file  . Highest education level: Not on file  Occupational History  . Not on file  Tobacco Use  . Smoking status: Current Every Day Smoker    Packs/day: 0.25    Types: Cigarettes  . Smokeless tobacco: Never Used  Substance and Sexual Activity  . Alcohol use: Not Currently    Comment: occ  . Drug use: No  . Sexual activity: Yes    Birth control/protection: Surgical    Comment: Hysterectomy  Other Topics Concern  . Not on file  Social History Narrative  . Not on file   Social Determinants of Health   Financial Resource Strain:   . Difficulty of Paying Living Expenses:   Food Insecurity:   . Worried About Programme researcher, broadcasting/film/video in the Last  Year:   . Ran Out of Food in the Last Year:   Transportation Needs:   . Freight forwarder (Medical):   Marland Kitchen Lack of Transportation (Non-Medical):   Physical Activity:   . Days of Exercise per Week:   . Minutes of Exercise per Session:   Stress:   . Feeling of Stress :   Social Connections:   . Frequency of Communication with Friends and Family:   . Frequency of Social Gatherings with Friends and Family:   . Attends Religious Services:   . Active Member of Clubs or Organizations:   . Attends Banker Meetings:   Marland Kitchen Marital Status:   Intimate Partner Violence:   . Fear of Current or Ex-Partner:   . Emotionally Abused:   Marland Kitchen Physically Abused:   . Sexually Abused:     No current outpatient medications on file prior to visit.   No current facility-administered  medications on file prior to visit.      ROS:  Review of Systems  Constitutional: Negative for fatigue, fever and unexpected weight change.  Respiratory: Negative for cough, shortness of breath and wheezing.   Cardiovascular: Negative for chest pain, palpitations and leg swelling.  Gastrointestinal: Negative for blood in stool, constipation, diarrhea, nausea, rectal pain and vomiting.  Endocrine: Negative for cold intolerance, heat intolerance and polyuria.  Genitourinary: Negative for dyspareunia, dysuria, flank pain, frequency, genital sores, hematuria, menstrual problem, pelvic pain, urgency, vaginal bleeding, vaginal discharge and vaginal pain.  Musculoskeletal: Negative for back pain, joint swelling and myalgias.  Skin: Negative for rash.  Neurological: Negative for dizziness, syncope, light-headedness, numbness and headaches.  Hematological: Negative for adenopathy.  Psychiatric/Behavioral: Negative for agitation, confusion, sleep disturbance and suicidal ideas. The patient is not nervous/anxious.     Objective: BP 120/80   Ht 5\' 8"  (1.727 m)   Wt 277 lb (125.6 kg)   BMI 42.12 kg/m    Physical Exam Constitutional:      Appearance: She is well-developed.  Genitourinary:     Vulva, vagina, right adnexa and left adnexa normal.     No vaginal discharge, erythema or tenderness.     Cervix is absent.     Uterus is absent.     No right or left adnexal mass present.     Right adnexa not tender.     Left adnexa not tender.     Genitourinary Comments: UTERUS/CX SURG REM  Neck:     Thyroid: No thyromegaly.  Cardiovascular:     Rate and Rhythm: Normal rate and regular rhythm.     Heart sounds: Normal heart sounds. No murmur.  Pulmonary:     Effort: Pulmonary effort is normal.     Breath sounds: Normal breath sounds.  Chest:     Breasts:        Right: No mass, nipple discharge, skin change or tenderness.        Left: No mass, nipple discharge, skin change or tenderness.    Abdominal:     Palpations: Abdomen is soft.     Tenderness: There is no abdominal tenderness. There is no guarding.  Musculoskeletal:        General: Normal range of motion.     Cervical back: Normal range of motion.  Neurological:     General: No focal deficit present.     Mental Status: She is alert and oriented to person, place, and time.     Cranial Nerves: No cranial nerve deficit.  Skin:  General: Skin is warm and dry.  Psychiatric:        Mood and Affect: Mood normal.        Behavior: Behavior normal.        Thought Content: Thought content normal.        Judgment: Judgment normal.  Vitals reviewed.     Assessment/Plan: Encounter for annual routine gynecological examination  Cervical cancer screening - Plan: Cytology - PAP  Screening for HPV (human papillomavirus) - Plan: Cytology - PAP  Cervical high risk human papillomavirus (HPV) DNA test positive - Plan: Cytology - PAP; will call with results.   Encounter for screening mammogram for malignant neoplasm of breast; pt has appt today  Anxiety - Rx RF xanax. Pt takes sparingly. - Plan: ALPRAZolam (XANAX) 0.5 MG tablet  Meds ordered this encounter  Medications  . ALPRAZolam (XANAX) 0.5 MG tablet    Sig: Take 1 tablet (0.5 mg total) by mouth 2 (two) times daily as needed for anxiety.    Dispense:  30 tablet    Refill:  0    Order Specific Question:   Supervising Provider    Answer:   Gae Dry [[id number]]             GYN counsel breast self exam, mammography screening, adequate intake of calcium and vitamin D, diet and exercise     F/U  Return in about 1 year (around 08/20/2020).  Casaundra Takacs B. Van Ehlert, PA-C 08/21/2019 1:58 PM

## 2019-08-22 ENCOUNTER — Encounter: Payer: Self-pay | Admitting: Obstetrics and Gynecology

## 2019-08-22 LAB — CYTOLOGY - PAP
Comment: NEGATIVE
Diagnosis: NEGATIVE
High risk HPV: NEGATIVE

## 2019-08-30 ENCOUNTER — Other Ambulatory Visit: Payer: Self-pay

## 2019-08-30 ENCOUNTER — Ambulatory Visit: Payer: BC Managed Care – PPO | Admitting: *Deleted

## 2019-08-30 DIAGNOSIS — F419 Anxiety disorder, unspecified: Secondary | ICD-10-CM

## 2019-08-30 DIAGNOSIS — J449 Chronic obstructive pulmonary disease, unspecified: Secondary | ICD-10-CM

## 2019-08-30 DIAGNOSIS — I10 Essential (primary) hypertension: Secondary | ICD-10-CM

## 2019-09-02 ENCOUNTER — Other Ambulatory Visit: Payer: Self-pay

## 2019-09-02 ENCOUNTER — Encounter: Payer: Self-pay | Admitting: Internal Medicine

## 2019-09-02 ENCOUNTER — Ambulatory Visit (INDEPENDENT_AMBULATORY_CARE_PROVIDER_SITE_OTHER): Payer: BC Managed Care – PPO | Admitting: Internal Medicine

## 2019-09-02 VITALS — BP 142/86 | HR 88 | Ht 68.0 in | Wt 277.2 lb

## 2019-09-02 DIAGNOSIS — F17200 Nicotine dependence, unspecified, uncomplicated: Secondary | ICD-10-CM

## 2019-09-02 DIAGNOSIS — M76891 Other specified enthesopathies of right lower limb, excluding foot: Secondary | ICD-10-CM

## 2019-09-02 DIAGNOSIS — F419 Anxiety disorder, unspecified: Secondary | ICD-10-CM

## 2019-09-02 DIAGNOSIS — M1612 Unilateral primary osteoarthritis, left hip: Secondary | ICD-10-CM

## 2019-09-02 DIAGNOSIS — I10 Essential (primary) hypertension: Secondary | ICD-10-CM

## 2019-09-02 DIAGNOSIS — J449 Chronic obstructive pulmonary disease, unspecified: Secondary | ICD-10-CM

## 2019-09-02 MED ORDER — ALPRAZOLAM 0.5 MG PO TABS: 0.2500 mg | ORAL_TABLET | Freq: Two times a day (BID) | ORAL | 0 refills | Status: AC | PRN

## 2019-09-02 NOTE — Progress Notes (Addendum)
Established Patient Office Visit  SUBJECTIVE:  Subjective  Patient ID: Ariah Mower, female    DOB: 1965-08-05  Age: 54 y.o. MRN: [id number]  CC:  Chief Complaint  Patient presents with  . lab results    HPI Reilynn Lauro is a 54 y.o. female presenting today for evaluation of her recent lab results.  She had her physical and Pap smear with Alicia Copland on 08/21/2019. Mammogram was done on 08/22/2019. She is scheduled for a colonoscopy in October 12, 2019.  She is still smoking 4 packs per week, but she denies having CP or SOB. 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 She denies depression and she takes Xanax as needed for her anxiety.  She was asking today about a CT angio of her chest since her paternal grandfather had a history of heart attacks.  Past Medical History:  Diagnosis Date  . Anemia   . Anxiety   . Cervical high risk human papillomavirus (HPV) DNA test positive   . Depression   . Family history of colon cancer in father   . Headache     Past Surgical History:  Procedure Laterality Date  . ABDOMINAL HYSTERECTOMY    . CESAREAN SECTION    . CHOLECYSTECTOMY    . COLONOSCOPY  2016   repeat in 5 yrs  . 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 Colon cancer Father 22  . Breast cancer Maternal Grandmother 59  . Ovarian cancer Neg Hx     Social History   Socioeconomic History  . Marital status: Divorced    Spouse name: Not on file  . Number of children: Not on file  . Years of education: Not on file  . Highest education level: Not on file  Occupational History  . Not on file  Tobacco Use  . Smoking status: Current Every Day Smoker    Packs/day: 0.25    Types: Cigarettes  . Smokeless tobacco: Never Used  Vaping  Use  . Vaping Use: Never used  Substance and Sexual Activity  . Alcohol use: Not Currently    Comment: occ  . Drug use: No  . Sexual activity: Yes    Birth control/protection: Surgical    Comment: Hysterectomy  Other Topics Concern  . Not on file  Social History Narrative  . Not on file   Social Determinants of Health   Financial Resource Strain:   . Difficulty of Paying Living Expenses:   Food Insecurity:   . Worried About Programme researcher, broadcasting/film/video in the Last Year:   . Barista in the Last Year:   Transportation Needs:   . Freight forwarder (Medical):   Marland Kitchen Lack of Transportation (Non-Medical):   Physical Activity:   . Days of Exercise per Week:   . Minutes of Exercise per Session:   Stress:   . Feeling of Stress :   Social Connections:   . Frequency of Communication with Friends and Family:   . Frequency of Social Gatherings with Friends and Family:   . Attends Religious Services:   . Active Member of Clubs or Organizations:   . Attends Banker Meetings:   Marland Kitchen Marital Status:   Intimate Partner Violence:   . Fear of Current  or Ex-Partner:   . Emotionally Abused:   Marland Kitchen Physically Abused:   . Sexually Abused:      Current Outpatient Medications:  .  ALPRAZolam (XANAX) 0.5 MG tablet, Take 0.5 tablets (0.25 mg total) by mouth 2 (two) times daily as needed for anxiety., Disp: 30 tablet, Rfl: 0   No Known Allergies  ROS Review of Systems  Constitutional: Negative.   HENT: Negative.   Eyes: Negative.   Respiratory: Negative.  Negative for shortness of breath.   Cardiovascular: Negative.  Negative for chest pain.  Gastrointestinal: Negative.   Endocrine: Negative.   Genitourinary: Negative.   Musculoskeletal: Positive for arthralgias (right hip pain).  Skin: Negative.   Allergic/Immunologic: Negative.   Neurological: Negative.   Hematological: Negative.   Psychiatric/Behavioral: Negative for dysphoric mood. The patient is nervous/anxious.   All  other systems reviewed and are negative.    OBJECTIVE:    Physical Exam Vitals reviewed.  Constitutional:      Appearance: Normal appearance.  HENT:     Mouth/Throat:     Mouth: Mucous membranes are moist.  Eyes:     Pupils: Pupils are equal, round, and reactive to light.  Cardiovascular:     Rate and Rhythm: Normal rate and regular rhythm.     Pulses: Normal pulses.     Heart sounds: Normal heart sounds.  Pulmonary:     Effort: Pulmonary effort is normal.     Breath sounds: Normal breath sounds.  Abdominal:     Palpations: There is no hepatomegaly, splenomegaly or mass.     Tenderness: There is no abdominal tenderness.  Musculoskeletal:     Right lower leg: No edema.     Left lower leg: No edema.  Neurological:     Mental Status: She is alert and oriented to person, place, and time.  Psychiatric:        Mood and Affect: Mood and affect normal.        Behavior: Behavior normal.     BP (!) 142/86   Pulse 88   Ht 5\' 8"  (1.727 m)   Wt 277 lb 3.2 oz (125.7 kg)   BMI 42.15 kg/m  Wt Readings from Last 3 Encounters:  09/02/19 277 lb 3.2 oz (125.7 kg)  08/21/19 277 lb (125.6 kg)  09/18/18 280 lb (127 kg)    Health Maintenance Due  Topic Date Due  . Hepatitis C Screening  Never done  . HIV Screening  Never done  . TETANUS/TDAP  Never done  . COLONOSCOPY  Never done    There are no preventive care reminders to display for this patient.  CBC Latest Ref Rng & Units 08/30/2019 12/20/2014 12/18/2014  WBC 3.8 - 10.8 Thousand/uL 8.8 11.2(H) 19.9(H)  Hemoglobin 11.7 - 15.5 g/dL 14.6 11.6(L) 13.3  Hematocrit 35 - 45 % 45.6(H) 35.4 40.4  Platelets 140 - 400 Thousand/uL 297 190 208   CMP Latest Ref Rng & Units 08/30/2019 12/20/2014 12/18/2014  Glucose 65 - 99 mg/dL 102(H) 122(H) -  BUN 7 - 25 mg/dL 11 10 -  Creatinine 0.50 - 1.05 mg/dL 0.85 0.84 0.68  Sodium 135 - 146 mmol/L 144 138 -  Potassium 3.5 - 5.3 mmol/L 4.7 3.9 -  Chloride 98 - 110 mmol/L 110 104 -  CO2 20 - 32  mmol/L 13(L) 27 -  Calcium 8.6 - 10.4 mg/dL 9.0 8.2(L) -  Total Protein 6.1 - 8.1 g/dL 6.6 - -  Total Bilirubin 0.2 - 1.2 mg/dL 0.3 - -  Alkaline Phos 50 - 136 Unit/L - - -  AST 10 - 35 U/L 25 - -  ALT 6 - 29 U/L 27 - -    Lab Results  Component Value Date   TSH 2.58 08/30/2019   Lab Results  Component Value Date   ALBUMIN 3.1 (L) 09/28/2012   ANIONGAP 7 12/20/2014   Lab Results  Component Value Date   CHOL 254 (H) 08/30/2019   HDL CANCELED 08/30/2019   LDLCALC CANCELED 08/30/2019   Lab Results  Component Value Date   TRIG 339 (H) 08/30/2019   Lab Results  Component Value Date   HGBA1C 6.0 09/28/2012      ASSESSMENT & PLAN:   Problem List Items Addressed This Visit      Cardiovascular and Mediastinum   Essential hypertension, benign    - Today, the patient's blood pressure is well managed on present med - The patient will continue the current treatment regimen.  - I encouraged the patient to eat a low-sodium diet to help control blood pressure. - I encouraged the patient to live an active lifestyle and complete activities that increases heart rate to 85% target heart rate at least 5 times per week for one hour.            Respiratory   Chronic obstructive pulmonary disease (HCC)    - The patient's COPD is stable with treatment.   - I informed the patient of the importance of getting routine flu and pneumonia vaccines.  - I instructed the patient to seek help if they experience more difficulty breathing than usual or if the pulse oximeter shows low oxygen for longer than 5 minutes. Stop smoking  And lose wt        Musculoskeletal and Integument   Primary osteoarthritis of left hip    Lose wt   , ref to ortho      Tendinitis involving right hip abductors     Other   Anxiety - Primary    - Patient experiencing high levels of anxiety.  - Encouraged patient to engage in relaxing activities like yoga, meditation, journaling, going for a walk, or  participating in a hobby.  - Encouraged patient to reach out to trusted friends or family members about recent struggles      Relevant Medications   ALPRAZolam (XANAX) 0.5 MG tablet   Morbid obesity (HCC)    - I encouraged the patient to lose weight.  - I educated them on making healthy dietary choices including eating more fruits and vegetables and less fried foods. - I encouraged the patient to exercise more, and educated on the benefits of exercise including weight loss, diabetes prevention, and hypertensioncontroll.        Smoker    Patient was advised to quit smoking completely.         Meds ordered this encounter  Medications  . ALPRAZolam (XANAX) 0.5 MG tablet    Sig: Take 0.5 tablets (0.25 mg total) by mouth 2 (two) times daily as needed for anxiety.    Dispense:  30 tablet    Refill:  0      Follow-up: Return in about 4 weeks (around 09/30/2019).  Colon scopy  Scheduled /stop smoking/ she is enquiring  About  Ct  Angio  Of chest  Dr. Woodroe Chen [facility] [address] [place], [place] [address]   By signing my name below, I, [employer], attest that this documentation has been prepared under  the direction and in the presence of Corky Downs, MD. Electronically Signed: Corky Downs, MD 09/11/19, 11:24 AM   I personally performed the services described in this documentation, which was SCRIBED in my presence. The recorded information has been reviewed and considered accurate. It has been edited as necessary during review. Corky Downs, MD

## 2019-09-04 ENCOUNTER — Encounter: Payer: Self-pay | Admitting: Internal Medicine

## 2019-09-04 DIAGNOSIS — J449 Chronic obstructive pulmonary disease, unspecified: Secondary | ICD-10-CM | POA: Insufficient documentation

## 2019-09-04 DIAGNOSIS — I10 Essential (primary) hypertension: Secondary | ICD-10-CM | POA: Insufficient documentation

## 2019-09-04 DIAGNOSIS — M76891 Other specified enthesopathies of right lower limb, excluding foot: Secondary | ICD-10-CM | POA: Insufficient documentation

## 2019-09-04 LAB — LIPID PANEL
Cholesterol: 254 mg/dL — ABNORMAL HIGH (ref <200)
Triglycerides: 339 mg/dL — ABNORMAL HIGH (ref <150)

## 2019-09-04 LAB — CBC WITH DIFFERENTIAL/PLATELET
Absolute Monocytes: 590 cells/uL (ref 200–950)
Basophils Absolute: 70 cells/uL (ref 0–200)
Basophils Relative: 0.8 %
Eosinophils Absolute: 308 cells/uL (ref 15–500)
Eosinophils Relative: 3.5 %
HCT: 45.6 % — ABNORMAL HIGH (ref 35.0–45.0)
Hemoglobin: 14.6 g/dL (ref 11.7–15.5)
Lymphs Abs: 3353 cells/uL (ref 850–3900)
MCH: 26 pg — ABNORMAL LOW (ref 27.0–33.0)
MCHC: 32 g/dL (ref 32.0–36.0)
MCV: 81.1 fL (ref 80.0–100.0)
MPV: 11 fL (ref 7.5–12.5)
Monocytes Relative: 6.7 %
Neutro Abs: 4479 cells/uL (ref 1500–7800)
Neutrophils Relative %: 50.9 %
Platelets: 297 10*3/uL (ref 140–400)
RBC: 5.62 10*6/uL — ABNORMAL HIGH (ref 3.80–5.10)
RDW: 14.9 % (ref 11.0–15.0)
Total Lymphocyte: 38.1 %
WBC: 8.8 10*3/uL (ref 3.8–10.8)

## 2019-09-04 LAB — COMPLETE METABOLIC PANEL WITH GFR
AG Ratio: 1.4 (calc) (ref 1.0–2.5)
ALT: 27 U/L (ref 6–29)
AST: 25 U/L (ref 10–35)
Albumin: 3.8 g/dL (ref 3.6–5.1)
Alkaline phosphatase (APISO): 76 U/L (ref 37–153)
BUN: 11 mg/dL (ref 7–25)
CO2: 13 mmol/L — ABNORMAL LOW (ref 20–32)
Calcium: 9 mg/dL (ref 8.6–10.4)
Chloride: 110 mmol/L (ref 98–110)
Creat: 0.85 mg/dL (ref 0.50–1.05)
GFR, Est African American: 92 mL/min/{1.73_m2} (ref >=60)
GFR, Est Non African American: 79 mL/min/{1.73_m2} (ref >=60)
Globulin: 2.8 g/dL (calc) (ref 1.9–3.7)
Glucose, Bld: 102 mg/dL — ABNORMAL HIGH (ref 65–99)
Potassium: 4.7 mmol/L (ref 3.5–5.3)
Sodium: 144 mmol/L (ref 135–146)
Total Bilirubin: 0.3 mg/dL (ref 0.2–1.2)
Total Protein: 6.6 g/dL (ref 6.1–8.1)

## 2019-09-04 LAB — TEST AUTHORIZATION

## 2019-09-04 LAB — TSH: TSH: 2.58 mIU/L

## 2019-09-04 NOTE — Assessment & Plan Note (Signed)
-   Today, the patient's blood pressure is well managed on present med. - The patient will continue the current treatment regimen.  - I encouraged the patient to eat a low-sodium diet to help control blood pressure. - I encouraged the patient to live an active lifestyle and complete activities that increases heart rate to 85% target heart rate at least 5 times per week for one hour.     

## 2019-09-04 NOTE — Assessment & Plan Note (Signed)
Lose wt   , ref to ortho

## 2019-09-04 NOTE — Assessment & Plan Note (Signed)
-   The patient's COPD is stable with treatment.   - I informed the patient of the importance of getting routine flu and pneumonia vaccines.  - I instructed the patient to seek help if they experience more difficulty breathing than usual or if the pulse oximeter shows low oxygen for longer than 5 minutes. Stop smoking  And lose wt

## 2019-09-04 NOTE — Assessment & Plan Note (Signed)
-   Patient experiencing high levels of anxiety.  - Encouraged patient to engage in relaxing activities like yoga, meditation, journaling, going for a walk, or participating in a hobby.  - Encouraged patient to reach out to trusted friends or family members about recent struggles 

## 2019-09-04 NOTE — Assessment & Plan Note (Signed)
-   I encouraged the patient to lose weight.  - I educated them on making healthy dietary choices including eating more fruits and vegetables and less fried foods. - I encouraged the patient to exercise more, and educated on the benefits of exercise including weight loss, diabetes prevention, and hypertension controll   

## 2019-09-11 DIAGNOSIS — F17200 Nicotine dependence, unspecified, uncomplicated: Secondary | ICD-10-CM | POA: Insufficient documentation

## 2019-09-11 NOTE — Assessment & Plan Note (Signed)
Patient was advised to quit smoking completely °

## 2019-10-18 ENCOUNTER — Ambulatory Visit: Payer: BC Managed Care – PPO | Admitting: Internal Medicine

## 2019-11-26 ENCOUNTER — Encounter: Payer: Self-pay | Admitting: Internal Medicine

## 2019-11-26 ENCOUNTER — Ambulatory Visit (INDEPENDENT_AMBULATORY_CARE_PROVIDER_SITE_OTHER): Payer: BC Managed Care – PPO | Admitting: Internal Medicine

## 2019-11-26 ENCOUNTER — Other Ambulatory Visit: Payer: Self-pay

## 2019-11-26 VITALS — BP 123/86 | HR 84 | Ht 67.0 in | Wt 267.2 lb

## 2019-11-26 DIAGNOSIS — N23 Unspecified renal colic: Secondary | ICD-10-CM | POA: Diagnosis not present

## 2019-11-26 DIAGNOSIS — M545 Low back pain, unspecified: Secondary | ICD-10-CM | POA: Insufficient documentation

## 2019-11-26 DIAGNOSIS — I10 Essential (primary) hypertension: Secondary | ICD-10-CM

## 2019-11-26 DIAGNOSIS — F419 Anxiety disorder, unspecified: Secondary | ICD-10-CM | POA: Diagnosis not present

## 2019-11-26 LAB — POCT URINALYSIS DIPSTICK
Bilirubin, UA: NEGATIVE
Blood, UA: POSITIVE
Glucose, UA: NEGATIVE
Ketones, UA: NEGATIVE
Leukocytes, UA: NEGATIVE
Nitrite, UA: NEGATIVE
Protein, UA: POSITIVE — AB
Spec Grav, UA: >=1.03 — AB (ref 1.010–1.025)
Urobilinogen, UA: NEGATIVE E.U./dL — AB
pH, UA: 5.5 (ref 5.0–8.0)

## 2019-11-26 MED ORDER — HYDROCODONE-ACETAMINOPHEN 10-325 MG PO TABS: 1.0000 | ORAL_TABLET | Freq: Three times a day (TID) | ORAL | 0 refills | Status: AC | PRN

## 2019-11-26 MED ORDER — KETOROLAC TROMETHAMINE 30 MG/ML IJ SOLN: 30.0000 mg | Freq: Once | INTRAMUSCULAR | Status: AC

## 2019-11-26 NOTE — Assessment & Plan Note (Signed)
-   I encouraged the patient to lose weight.  - I educated them on making healthy dietary choices including eating more fruits and vegetables and less fried foods. - I encouraged the patient to exercise more, and educated on the benefits of exercise including weight loss, diabetes prevention, and hypertension prevention.   

## 2019-11-26 NOTE — Assessment & Plan Note (Signed)
Pt presented to the office with right renal pain not radiating to the back. She denies any history of fever or chills, blood in the urine. She has a hx of kidney stones in the past. Her pain was 7/10. She was given a toradol shot 30 mg IM. She was also given Vicoden 15 tablet to be used as directed. She was advised to go to the hospital if the pain because severe or unbearable. We'll schedule her for an US of the kidneys.

## 2019-11-26 NOTE — Assessment & Plan Note (Signed)
Urine test was normal.

## 2019-11-26 NOTE — Progress Notes (Signed)
Established Patient Office Visit  SUBJECTIVE:  Subjective  Patient ID: Kimberly Nixon, female    DOB: [birth year]  Age: 54 y.o. MRN: [id number]  CC:  Chief Complaint  Patient presents with  . Back Pain    patient having lower right sided back pain/ kidney pain, causing her to have cold sweats at times, constant pain cuasing a lot of discomfort  . painful urination    patient having some burning     HPI Kimberly Nixon is a 54 y.o. female presenting today for flank pain with painful urination.  Flank Pain This is a new problem. The current episode started in the past 7 days. The problem occurs constantly. The problem has been gradually worsening since onset. The pain is present in the lumbar spine. Radiates to: Abdomen RUQ. The pain is at a severity of 10/10. The pain is the same all the time. Associated symptoms include abdominal pain (RUQ) and dysuria (mild). Risk factors include sedentary lifestyle, obesity and menopause (Hx of kidney stones). She has tried bed rest and NSAIDs for the symptoms. The treatment provided no relief.     Past Medical History:  Diagnosis Date  . Anemia   . Anxiety   . Cervical high risk human papillomavirus (HPV) DNA test positive   . Depression   . Family history of colon cancer in father   . Headache     Past Surgical History:  Procedure Laterality Date  . ABDOMINAL HYSTERECTOMY    . CESAREAN SECTION    . CHOLECYSTECTOMY    . COLONOSCOPY  2016   repeat in 5 yrs  . 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 Colon cancer Father 6  . Breast cancer Maternal Grandmother 37  . Ovarian cancer Neg Hx     Social History   Socioeconomic History  . Marital status: Divorced    Spouse name: Not on file  . Number of children: Not on file  . Years of  education: Not on file  . Highest education level: Not on file  Occupational History  . Not on file  Tobacco Use  . Smoking status: Current Every Day Smoker    Packs/day: 0.25    Types: Cigarettes  . Smokeless tobacco: Never Used  Vaping Use  . Vaping Use: Never used  Substance and Sexual Activity  . Alcohol use: Not Currently    Comment: occ  . Drug use: No  . Sexual activity: Yes    Birth control/protection: Surgical    Comment: Hysterectomy  Other Topics Concern  . Not on file  Social History Narrative  . Not on file   Social Determinants of Health   Financial Resource Strain:   . Difficulty of Paying Living Expenses: Not on file  Food Insecurity:   . Worried About Programme researcher, broadcasting/film/video in the Last Year: Not on file  . Ran Out of Food in the Last Year: Not on file  Transportation Needs:   . Lack of Transportation (Medical): Not on file  . Lack of Transportation (Non-Medical): Not on file  Physical Activity:   . Days of Exercise per Week: Not on file  . Minutes of Exercise per Session: Not on file  Stress:   . Feeling of Stress : Not on file  Social Connections:   .  Frequency of Communication with Friends and Family: Not on file  . Frequency of Social Gatherings with Friends and Family: Not on file  . Attends Religious Services: Not on file  . Active Member of Clubs or Organizations: Not on file  . Attends BankerClub or Organization Meetings: Not on file  . Marital Status: Not on file  Intimate Partner Violence:   . Fear of Current or Ex-Partner: Not on file  . Emotionally Abused: Not on file  . Physically Abused: Not on file  . Sexually Abused: Not on file     Current Outpatient Medications:  .  ALPRAZolam (XANAX) 0.25 MG tablet, Take 0.25 mg by mouth daily., Disp: , Rfl:  .  HYDROcodone-acetaminophen (NORCO) 10-325 MG tablet, Take 1 tablet by mouth every 8 (eight) hours as needed for up to 5 days., Disp: 15 tablet, Rfl: 0  Current Facility-Administered  Medications:  .  ketorolac (TORADOL) 30 MG/ML injection 30 mg, 30 mg, Intramuscular, Once, Tyshaun Vinzant, Renda RollsJaved, MD   No Known Allergies  ROS Review of Systems  Constitutional: Negative.   HENT: Negative.   Eyes: Negative.   Respiratory: Negative.   Cardiovascular: Negative.   Gastrointestinal: Positive for abdominal pain (RUQ).  Endocrine: Negative.   Genitourinary: Positive for dysuria (mild), flank pain and frequency (decreased). Negative for hematuria.  Musculoskeletal: Positive for back pain.  Skin: Negative.   Allergic/Immunologic: Negative.   Neurological: Negative.  Negative for dizziness and syncope.  Hematological: Negative.   Psychiatric/Behavioral: Negative.   All other systems reviewed and are negative.    OBJECTIVE:    Physical Exam Vitals reviewed.  Constitutional:      Appearance: Normal appearance.  HENT:     Mouth/Throat:     Mouth: Mucous membranes are moist.  Eyes:     Pupils: Pupils are equal, round, and reactive to light.  Neck:     Vascular: No carotid bruit.  Cardiovascular:     Rate and Rhythm: Normal rate and regular rhythm.     Pulses: Normal pulses.     Heart sounds: Normal heart sounds.  Pulmonary:     Effort: Pulmonary effort is normal.     Breath sounds: Normal breath sounds.  Abdominal:     General: Bowel sounds are normal.     Palpations: Abdomen is soft. There is no hepatomegaly, splenomegaly or mass.     Tenderness: There is abdominal tenderness in the right lower quadrant.     Hernia: No hernia is present.  Musculoskeletal:     Cervical back: Neck supple.     Lumbar back: Tenderness (R flank) present.     Right lower leg: No edema.     Left lower leg: No edema.  Skin:    Findings: No rash.  Neurological:     Mental Status: She is alert and oriented to person, place, and time.     Motor: No weakness.  Psychiatric:        Mood and Affect: Mood and affect normal.        Behavior: Behavior normal.     BP 123/86   Pulse 84    Ht 5\' 7"  (1.702 m)   Wt 267 lb 3.2 oz (121.2 kg)   BMI 41.85 kg/m  Wt Readings from Last 3 Encounters:  11/26/19 267 lb 3.2 oz (121.2 kg)  09/02/19 277 lb 3.2 oz (125.7 kg)  08/21/19 277 lb (125.6 kg)    Health Maintenance Due  Topic Date Due  . Hepatitis C Screening  Never  done  . HIV Screening  Never done  . TETANUS/TDAP  Never done  . COLONOSCOPY  Never done  . INFLUENZA VACCINE  Never done    There are no preventive care reminders to display for this patient.  CBC Latest Ref Rng & Units 08/30/2019 12/20/2014 12/18/2014  WBC 3.8 - 10.8 Thousand/uL 8.8 11.2(H) 19.9(H)  Hemoglobin 11.7 - 15.5 g/dL 89.2 11.6(L) 13.3  Hematocrit 35 - 45 % 45.6(H) 35.4 40.4  Platelets 140 - 400 Thousand/uL 297 190 208   CMP Latest Ref Rng & Units 08/30/2019 12/20/2014 12/18/2014  Glucose 65 - 99 mg/dL 119(E) 174(Y) -  BUN 7 - 25 mg/dL 11 10 -  Creatinine 8.14 - 1.05 mg/dL 4.81 8.56 3.14  Sodium 135 - 146 mmol/L 144 138 -  Potassium 3.5 - 5.3 mmol/L 4.7 3.9 -  Chloride 98 - 110 mmol/L 110 104 -  CO2 20 - 32 mmol/L 13(L) 27 -  Calcium 8.6 - 10.4 mg/dL 9.0 9.7(W) -  Total Protein 6.1 - 8.1 g/dL 6.6 - -  Total Bilirubin 0.2 - 1.2 mg/dL 0.3 - -  Alkaline Phos 50 - 136 Unit/L - - -  AST 10 - 35 U/L 25 - -  ALT 6 - 29 U/L 27 - -    Lab Results  Component Value Date   TSH 2.58 08/30/2019   Lab Results  Component Value Date   ALBUMIN 3.1 (L) 09/28/2012   ANIONGAP 7 12/20/2014   Lab Results  Component Value Date   CHOL 254 (H) 08/30/2019   HDL CANCELED 08/30/2019   LDLCALC CANCELED 08/30/2019   Lab Results  Component Value Date   TRIG 339 (H) 08/30/2019   Lab Results  Component Value Date   HGBA1C 6.0 09/28/2012      ASSESSMENT & PLAN:   Problem List Items Addressed This Visit      Cardiovascular and Mediastinum   Essential hypertension, benign    - Today, the patient's blood pressure is well managed on diet alone. - The patient will continue the current treatment regimen.  -  I encouraged the patient to eat a low-sodium diet to help control blood pressure. - I encouraged the patient to live an active lifestyle and complete activities that increases heart rate to 85% target heart rate at least 5 times per week for one hour.         Other   Anxiety    - Patient experiencing high levels of anxiety.  - Encouraged patient to engage in relaxing activities like yoga, meditation, journaling, going for a walk, or participating in a hobby.  - Encouraged patient to reach out to trusted friends or family members about recent struggles      Relevant Medications   ALPRAZolam (XANAX) 0.25 MG tablet   Morbid obesity (HCC)    - I encouraged the patient to lose weight.  - I educated them on making healthy dietary choices including eating more fruits and vegetables and less fried foods. - I encouraged the patient to exercise more, and educated on the benefits of exercise including weight loss, diabetes prevention, and hypertension prevention.        Renal colic on right side    Pt presented to the office with right renal pain not radiating to the back. She denies any history of fever or chills, blood in the urine. She has a hx of kidney stones in the past. Her pain was 7/10. She was given a toradol shot 30 mg IM.  She was also given Vicoden 15 tablet to be used as directed. She was advised to go to the hospital if the pain because severe or unbearable. We'll schedule her for an US of the kidneys.       Right-sided low back pain without sciatica - Primary    Urine test was normal.       Relevant Medications   HYDROcodone-acetaminophen (NORCO) 10-325 MG tablet   ketorolac (TORADOL) 30 MG/ML injection 30 mg   Other Relevant Orders   POCT urinalysis dipstick (Completed)   US RENAL      Meds ordered this encounter  Medications  . HYDROcodone-acetaminophen (NORCO) 10-325 MG tablet    Sig: Take 1 tablet by mouth every 8 (eight) hours as needed for up to 5 days.    Dispense:   15 tablet    Refill:  0  . ketorolac (TORADOL) 30 MG/ML injection 30 mg    Follow-up: No follow-ups on file.    Corky Downs, MD [facility] [address], [place], [place] [address]   By signing my name below, I, [employer], attest that this documentation has been prepared under the direction and in the presence of Dr. Corky Downs Electronically Signed: Corky Downs, MD 11/26/19, 5:03 PM  I personally performed the services described in this documentation, which was SCRIBED in my presence. The recorded information has been reviewed and considered accurate. It has been edited as necessary during review. Corky Downs, MD

## 2019-11-26 NOTE — Assessment & Plan Note (Signed)
-   Patient experiencing high levels of anxiety.  - Encouraged patient to engage in relaxing activities like yoga, meditation, journaling, going for a walk, or participating in a hobby.  - Encouraged patient to reach out to trusted friends or family members about recent struggles 

## 2019-11-26 NOTE — Assessment & Plan Note (Signed)
-   Today, the patient's blood pressure is well managed on diet alone. - The patient will continue the current treatment regimen.  - I encouraged the patient to eat a low-sodium diet to help control blood pressure. - I encouraged the patient to live an active lifestyle and complete activities that increases heart rate to 85% target heart rate at least 5 times per week for one hour.     

## 2019-11-28 ENCOUNTER — Other Ambulatory Visit: Payer: Self-pay

## 2019-11-28 ENCOUNTER — Encounter (INDEPENDENT_AMBULATORY_CARE_PROVIDER_SITE_OTHER): Payer: Self-pay

## 2019-11-28 ENCOUNTER — Ambulatory Visit
Admission: RE | Admit: 2019-11-28 | Discharge: 2019-11-28 | Disposition: A | Discharge status: Home / Self Care | Payer: BC Managed Care – PPO | Source: Ambulatory Visit | Attending: Internal Medicine | Admitting: Internal Medicine

## 2019-11-28 DIAGNOSIS — M545 Low back pain, unspecified: Secondary | ICD-10-CM

## 2019-11-29 MED ORDER — FLUCONAZOLE 150 MG PO TABS: 150.0000 mg | ORAL_TABLET | Freq: Once | ORAL | 0 refills | Status: AC

## 2019-11-29 MED ORDER — CIPROFLOXACIN HCL 500 MG PO TABS: 500.0000 mg | ORAL_TABLET | Freq: Two times a day (BID) | ORAL | 0 refills | Status: DC

## 2019-11-29 NOTE — Addendum Note (Signed)
Addended by: Jobie Quaker on: 11/29/2019 04:35 PM   Modules accepted: Orders

## 2019-12-05 ENCOUNTER — Ambulatory Visit: Payer: BC Managed Care – PPO | Admitting: Internal Medicine

## 2019-12-19 ENCOUNTER — Ambulatory Visit: Payer: BC Managed Care – PPO | Admitting: Family Medicine

## 2019-12-24 ENCOUNTER — Ambulatory Visit (INDEPENDENT_AMBULATORY_CARE_PROVIDER_SITE_OTHER): Payer: BC Managed Care – PPO | Admitting: Internal Medicine

## 2019-12-24 ENCOUNTER — Other Ambulatory Visit: Payer: Self-pay

## 2019-12-24 DIAGNOSIS — I10 Essential (primary) hypertension: Secondary | ICD-10-CM | POA: Diagnosis not present

## 2019-12-24 DIAGNOSIS — M545 Low back pain, unspecified: Secondary | ICD-10-CM

## 2019-12-24 DIAGNOSIS — Z23 Encounter for immunization: Secondary | ICD-10-CM | POA: Insufficient documentation

## 2019-12-24 DIAGNOSIS — F419 Anxiety disorder, unspecified: Secondary | ICD-10-CM

## 2019-12-24 DIAGNOSIS — F17200 Nicotine dependence, unspecified, uncomplicated: Secondary | ICD-10-CM | POA: Diagnosis not present

## 2019-12-24 LAB — POCT URINALYSIS DIPSTICK
Blood, UA: POSITIVE
Glucose, UA: NEGATIVE
Ketones, UA: NEGATIVE
Leukocytes, UA: NEGATIVE
Nitrite, UA: NEGATIVE
Protein, UA: POSITIVE — AB
Spec Grav, UA: >=1.03 — AB (ref 1.010–1.025)
Urobilinogen, UA: 0.2 E.U./dL
pH, UA: 5 (ref 5.0–8.0)

## 2019-12-24 MED ORDER — ALPRAZOLAM 0.25 MG PO TABS
0.5000 mg | ORAL_TABLET | Freq: Two times a day (BID) | ORAL | 0 refills | Status: DC
Start: 2019-12-24 — End: 2020-08-07

## 2019-12-24 NOTE — Assessment & Plan Note (Signed)
-   I instructed the patient to stop smoking and provided them with smoking cessation materials.  - I informed the patient that smoking puts them at increased risk for cancer, COPD, hypertension, and more.  - Informed the patient to seek help if they begin to have trouble breathing, develop chest pain, start to cough up blood, feel faint, or pass out.  

## 2019-12-24 NOTE — Assessment & Plan Note (Signed)
-   Patient experiencing high levels of anxiety.  - Encouraged patient to engage in relaxing activities like yoga, meditation, journaling, going for a walk, or participating in a hobby.  - Encouraged patient to reach out to trusted friends or family members about recent struggles 

## 2019-12-24 NOTE — Assessment & Plan Note (Signed)
-   I encouraged the patient to lose weight.  - I educated them on making healthy dietary choices including eating more fruits and vegetables and less fried foods. - I encouraged the patient to exercise more, and educated on the benefits of exercise including weight loss, diabetes management, and hypertension management.   

## 2019-12-24 NOTE — Assessment & Plan Note (Signed)
-   Today, the patient's blood pressure is well managed on diet. - The patient will continue the current treatment regimen.  - I encouraged the patient to eat a low-sodium diet to help control blood pressure. - I encouraged the patient to live an active lifestyle and complete activities that increases heart rate to 85% target heart rate at least 5 times per week for one hour.     

## 2019-12-24 NOTE — Assessment & Plan Note (Signed)
Patient back pain is stable she was advised to keep on doing back exercises.  She was also advised to lose weight.  Patient was emphasized on the importance of quitting smoking.

## 2019-12-24 NOTE — Progress Notes (Signed)
Established Patient Office Visit  SUBJECTIVE:  Subjective  Patient ID: Kimberly Nixon, female    DOB: May 23, 1965  Age: 54 y.o. MRN: [id number]  CC:  Chief Complaint  Patient presents with  . Back Pain    follow up     HPI Kimberly Nixon is a 54 y.o. female presenting today for a follow up of her back pain.   Her back pain is stable at the present time. 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 She does need some Xanax for her anxiety due to COVID19 and working a lot.   She will get her COVID19 booster today.    Past Medical History:  Diagnosis Date  . Anemia   . Anxiety   . Cervical high risk human papillomavirus (HPV) DNA test positive   . Depression   . Family history of colon cancer in father   . Headache     Past Surgical History:  Procedure Laterality Date  . ABDOMINAL HYSTERECTOMY    . CESAREAN SECTION    . CHOLECYSTECTOMY    . COLONOSCOPY  2016   repeat in 5 yrs  . 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 Colon cancer Father 64  . Breast cancer Maternal Grandmother 7  . Ovarian cancer Neg Hx     Social History   Socioeconomic History  . Marital status: Divorced    Spouse name: Not on file  . Number of children: Not on file  . Years of education: Not on file  . Highest education level: Not on file  Occupational History  . Not on file  Tobacco Use  . Smoking status: Current Every Day Smoker    Packs/day: 0.25    Types: Cigarettes  . Smokeless tobacco: Never Used  Vaping Use  . Vaping Use: Never used  Substance and Sexual Activity  . Alcohol use: Not Currently    Comment: occ  . Drug use: No  . Sexual activity: Yes    Birth control/protection: Surgical    Comment: Hysterectomy  Other Topics Concern  . Not on file  Social History  Narrative  . Not on file   Social Determinants of Health   Financial Resource Strain:   . Difficulty of Paying Living Expenses: Not on file  Food Insecurity:   . Worried About Programme researcher, broadcasting/film/video in the Last Year: Not on file  . Ran Out of Food in the Last Year: Not on file  Transportation Needs:   . Lack of Transportation (Medical): Not on file  . Lack of Transportation (Non-Medical): Not on file  Physical Activity:   . Days of Exercise per Week: Not on file  . Minutes of Exercise per Session: Not on file  Stress:   . Feeling of Stress : Not on file  Social Connections:   . Frequency of Communication with Friends and Family: Not on file  . Frequency of Social Gatherings with Friends and Family: Not on file  . Attends Religious Services: Not on file  . Active Member of Clubs or Organizations: Not on file  . Attends Banker Meetings: Not on file  . Marital Status: Not on file  Intimate Partner Violence:   . Fear of Current or Ex-Partner: Not on file  . Emotionally Abused:  Not on file  . Physically Abused: Not on file  . Sexually Abused: Not on file     Current Outpatient Medications:  .  ALPRAZolam (XANAX) 0.25 MG tablet, Take 2 tablets (0.5 mg total) by mouth 2 (two) times daily., Disp: 60 tablet, Rfl: 0 .  ciprofloxacin (CIPRO) 500 MG tablet, Take 1 tablet (500 mg total) by mouth 2 (two) times daily., Disp: 20 tablet, Rfl: 0   No Known Allergies  ROS Review of Systems  Constitutional: Negative.   HENT: Negative.   Eyes: Negative.   Respiratory: Negative.   Cardiovascular: Negative.   Gastrointestinal: Negative.   Endocrine: Negative.   Genitourinary: Negative.   Musculoskeletal: Positive for back pain.  Skin: Negative.   Allergic/Immunologic: Negative.   Neurological: Negative.   Hematological: Negative.   Psychiatric/Behavioral: The patient is nervous/anxious.   All other systems reviewed and are negative.    OBJECTIVE:    Physical  Exam Vitals reviewed.  Constitutional:      Appearance: Normal appearance. She is obese.  HENT:     Mouth/Throat:     Mouth: Mucous membranes are moist.  Eyes:     Pupils: Pupils are equal, round, and reactive to light.  Neck:     Vascular: No carotid bruit.  Cardiovascular:     Rate and Rhythm: Normal rate and regular rhythm.     Pulses: Normal pulses.     Heart sounds: Normal heart sounds.  Pulmonary:     Effort: Pulmonary effort is normal.     Breath sounds: Normal breath sounds.  Abdominal:     General: Bowel sounds are normal.     Palpations: Abdomen is soft. There is no hepatomegaly, splenomegaly or mass.     Tenderness: There is no abdominal tenderness.     Hernia: No hernia is present.  Musculoskeletal:        General: No tenderness.     Cervical back: Neck supple.     Right lower leg: No edema.     Left lower leg: No edema.  Skin:    Findings: No rash.  Neurological:     Mental Status: She is alert and oriented to person, place, and time.     Motor: No weakness.  Psychiatric:        Mood and Affect: Mood and affect normal.        Behavior: Behavior normal.     There were no vitals taken for this visit. Wt Readings from Last 3 Encounters:  11/26/19 267 lb 3.2 oz (121.2 kg)  09/02/19 277 lb 3.2 oz (125.7 kg)  08/21/19 277 lb (125.6 kg)    Health Maintenance Due  Topic Date Due  . Hepatitis C Screening  Never done  . HIV Screening  Never done  . TETANUS/TDAP  Never done  . COLONOSCOPY  Never done  . INFLUENZA VACCINE  Never done    There are no preventive care reminders to display for this patient.  CBC Latest Ref Rng & Units 08/30/2019 12/20/2014 12/18/2014  WBC 3.8 - 10.8 Thousand/uL 8.8 11.2(H) 19.9(H)  Hemoglobin 11.7 - 15.5 g/dL 16.114.6 11.6(L) 13.3  Hematocrit 35 - 45 % 45.6(H) 35.4 40.4  Platelets 140 - 400 Thousand/uL 297 190 208   CMP Latest Ref Rng & Units 08/30/2019 12/20/2014 12/18/2014  Glucose 65 - 99 mg/dL 096(E102(H) 454(U122(H) -  BUN 7 - 25 mg/dL  11 10 -  Creatinine 9.810.50 - 1.05 mg/dL 1.910.85 4.780.84 2.950.68  Sodium 135 - 146 mmol/L 144  138 -  Potassium 3.5 - 5.3 mmol/L 4.7 3.9 -  Chloride 98 - 110 mmol/L 110 104 -  CO2 20 - 32 mmol/L 13(L) 27 -  Calcium 8.6 - 10.4 mg/dL 9.0 6.4(Q) -  Total Protein 6.1 - 8.1 g/dL 6.6 - -  Total Bilirubin 0.2 - 1.2 mg/dL 0.3 - -  Alkaline Phos 50 - 136 Unit/L - - -  AST 10 - 35 U/L 25 - -  ALT 6 - 29 U/L 27 - -    Lab Results  Component Value Date   TSH 2.58 08/30/2019   Lab Results  Component Value Date   ALBUMIN 3.1 (L) 09/28/2012   ANIONGAP 7 12/20/2014   Lab Results  Component Value Date   CHOL 254 (H) 08/30/2019   HDL CANCELED 08/30/2019   LDLCALC CANCELED 08/30/2019   Lab Results  Component Value Date   TRIG 339 (H) 08/30/2019   Lab Results  Component Value Date   HGBA1C 6.0 09/28/2012      ASSESSMENT & PLAN:   Problem List Items Addressed This Visit      Cardiovascular and Mediastinum   Essential hypertension    - Today, the patient's blood pressure is well managed on diet. - The patient will continue the current treatment regimen.  - I encouraged the patient to eat a low-sodium diet to help control blood pressure. - I encouraged the patient to live an active lifestyle and complete activities that increases heart rate to 85% target heart rate at least 5 times per week for one hour.            Other   Anxiety    - Patient experiencing high levels of anxiety.  - Encouraged patient to engage in relaxing activities like yoga, meditation, journaling, going for a walk, or participating in a hobby.  - Encouraged patient to reach out to trusted friends or family members about recent struggles       Relevant Medications   ALPRAZolam (XANAX) 0.25 MG tablet   Morbid obesity (HCC)    - I encouraged the patient to lose weight.  - I educated them on making healthy dietary choices including eating more fruits and vegetables and less fried foods. - I encouraged the patient to  exercise more, and educated on the benefits of exercise including weight loss, diabetes management, and hypertension management.        Smoker    - I instructed the patient to stop smoking and provided them with smoking cessation materials.  - I informed the patient that smoking puts them at increased risk for cancer, COPD, hypertension, and more.  - Informed the patient to seek help if they begin to have trouble breathing, develop chest pain, start to cough up blood, feel faint, or pass out.        Right-sided low back pain without sciatica - Primary    Patient back pain is stable she was advised to keep on doing back exercises.  She was also advised to lose weight.  Patient was emphasized on the importance of quitting smoking.      Relevant Orders   POCT urinalysis dipstick (Completed)   Need for COVID-19 vaccine    Patient was vaccinated with a Covid      Relevant Orders   Pfizer SARS-COV-2 Vaccine      Meds ordered this encounter  Medications  . ALPRAZolam (XANAX) 0.25 MG tablet    Sig: Take 2 tablets (0.5 mg total) by mouth 2 (  two) times daily.    Dispense:  60 tablet    Refill:  0    Follow-up: No follow-ups on file.    Corky Downs, MD [facility] [address] [place], [place] [address]   By signing my name below, I, [employer], attest that this documentation has been prepared under the direction and in the presence of Dr. Corky Downs Electronically Signed: Corky Downs, MD 12/24/19, 6:14 PM  I personally performed the services described in this documentation, which was SCRIBED in my presence. The recorded information has been reviewed and considered accurate. It has been edited as necessary during review. Corky Downs, MD

## 2019-12-24 NOTE — Assessment & Plan Note (Signed)
Patient was vaccinated with a Covid

## 2019-12-25 DIAGNOSIS — Z23 Encounter for immunization: Secondary | ICD-10-CM | POA: Diagnosis not present

## 2020-04-16 ENCOUNTER — Emergency Department: Payer: No Typology Code available for payment source

## 2020-04-16 ENCOUNTER — Other Ambulatory Visit: Payer: Self-pay

## 2020-04-16 ENCOUNTER — Emergency Department
Admission: EM | Admit: 2020-04-16 | Discharge: 2020-04-16 | Disposition: A | Discharge status: Home / Self Care | Payer: No Typology Code available for payment source | Attending: Emergency Medicine | Admitting: Emergency Medicine

## 2020-04-16 ENCOUNTER — Ambulatory Visit: Payer: BC Managed Care – PPO | Admitting: Family Medicine

## 2020-04-16 DIAGNOSIS — W108XXA Fall (on) (from) other stairs and steps, initial encounter: Secondary | ICD-10-CM | POA: Diagnosis not present

## 2020-04-16 DIAGNOSIS — I10 Essential (primary) hypertension: Secondary | ICD-10-CM | POA: Diagnosis not present

## 2020-04-16 DIAGNOSIS — J449 Chronic obstructive pulmonary disease, unspecified: Secondary | ICD-10-CM | POA: Diagnosis not present

## 2020-04-16 DIAGNOSIS — S0990XA Unspecified injury of head, initial encounter: Secondary | ICD-10-CM | POA: Diagnosis not present

## 2020-04-16 DIAGNOSIS — W19XXXA Unspecified fall, initial encounter: Secondary | ICD-10-CM

## 2020-04-16 DIAGNOSIS — F17210 Nicotine dependence, cigarettes, uncomplicated: Secondary | ICD-10-CM | POA: Diagnosis not present

## 2020-04-16 DIAGNOSIS — Z96642 Presence of left artificial hip joint: Secondary | ICD-10-CM | POA: Insufficient documentation

## 2020-04-16 NOTE — ED Notes (Signed)
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 Pt a/o x4  Reports pain to back  Supervisor: Barry Dienes,  [phone]

## 2020-04-16 NOTE — ED Notes (Signed)
Per supervisor, he believes a UDS is required but is calling to verify

## 2020-04-16 NOTE — ED Triage Notes (Addendum)
Pt comes via POV from home with c/o mechanical fall. Pt states she had tripped and fell. Pt states she hit her head. Pt states hip and knee pain.  Pt states she was sent here from [facility] for [facility] of her head. Pt states she has already had other scans completed.

## 2020-04-16 NOTE — ED Provider Notes (Signed)
[facility] Emergency Department Provider Note  ____________________________________________  Time seen: Approximately 9:33 PM  I have reviewed the triage vital signs and the nursing notes.   HISTORY  Chief Complaint Fall    HPI Kimberly Nixon is a 55 y.o. female who presents the emergency department for CT scan of her head.  Patient had a an injury where she fell down 6 stairs, did hit her head today.  Patient initially presented to urgent care for other musculoskeletal complaints, was evaluated and had imaging of these without any acute finding.  Given the fact that the patient hit her head and did fall 6 tears she was sent to the emergency department for CT scan.  Patient has slight headache but is not lost consciousness, no visual changes, no weakness one-sided of the other.  Patient is requesting imaging of head but is not concerned about other musculoskeletal complaints at this time.         Past Medical History:  Diagnosis Date  . Anemia   . Anxiety   . Cervical high risk human papillomavirus (HPV) DNA test positive   . Depression   . Family history of colon cancer in father   . Headache     Patient Active Problem List   Diagnosis Date Noted  . Need for COVID-19 vaccine 12/24/2019  . Renal colic on right side 11/26/2019  . Right-sided low back pain without sciatica 11/26/2019  . Smoker 09/11/2019  . Essential hypertension 09/04/2019  . Chronic obstructive pulmonary disease (HCC) 09/04/2019  . Morbid obesity (HCC) 09/04/2019  . Tendinitis involving right hip abductors 09/04/2019  . Anxiety 08/21/2019  . History of hysterectomy 09/18/2018  . Cervical high risk human papillomavirus (HPV) DNA test positive 08/20/2018  . Primary osteoarthritis of left hip 12/18/2014    Past Surgical History:  Procedure Laterality Date  . ABDOMINAL HYSTERECTOMY    . CESAREAN SECTION    . CHOLECYSTECTOMY    . COLONOSCOPY  2016   repeat in 5 yrs  . TOTAL  HIP ARTHROPLASTY Left 12/18/2014   Procedure: TOTAL HIP ARTHROPLASTY ANTERIOR APPROACH;  Surgeon: Kennedy Bucker, MD;  Location: [facility] ORS;  Service: Orthopedics;  Laterality: Left;    Prior to Admission medications   Medication Sig Start Date End Date Taking? Authorizing Provider  ALPRAZolam Prudy Feeler) 0.25 MG tablet Take 2 tablets (0.5 mg total) by mouth 2 (two) times daily. 12/24/19   Corky Downs, MD  ciprofloxacin (CIPRO) 500 MG tablet Take 1 tablet (500 mg total) by mouth 2 (two) times daily. 11/29/19   Corky Downs, MD    Allergies Patient has no known allergies.  Family History  Problem Relation Age of Onset  . Colon cancer Father 71  . Breast cancer Maternal Grandmother 71  . Ovarian cancer Neg Hx     Social History Social History   Tobacco Use  . Smoking status: Current Every Day Smoker    Packs/day: 0.25    Types: Cigarettes  . Smokeless tobacco: Never Used  Vaping Use  . Vaping Use: Never used  Substance Use Topics  . Alcohol use: Not Currently    Comment: occ  . Drug use: No     Review of Systems  Constitutional: No fever/chills Eyes: No visual changes. No discharge ENT: No upper respiratory complaints. Cardiovascular: no chest pain. Respiratory: no cough. No SOB. Gastrointestinal: No abdominal pain.  No nausea, no vomiting.  No diarrhea.  No constipation. Musculoskeletal: Pain to the hip, knee, and Skin: Negative for  rash, abrasions, lacerations, ecchymosis. Neurological: Negative for headaches, focal weakness or numbness.  10 System ROS otherwise negative.  ____________________________________________   PHYSICAL EXAM:  VITAL SIGNS: ED Triage Vitals  Enc Vitals Group     BP 04/16/20 1845 125/85     Pulse Rate 04/16/20 1845 85     Resp 04/16/20 1845 18     Temp 04/16/20 1845 98.6 F (37 C)     Temp Source 04/16/20 1845 Oral     SpO2 --      Weight --      Height --      Head Circumference --      Peak Flow --      Pain Score 04/16/20 1848 7      Pain Loc --      Pain Edu? --      Excl. in GC? --      Constitutional: Alert and oriented. Well appearing and in no acute distress. Eyes: Conjunctivae are normal. PERRL. EOMI. Head: Atraumatic.  No acute signs of trauma with abrasions, ecchymosis, lacerations.  Nontender over the osseous structures of the skull and face with no crepitus.  No battle signs, no raccoon eyes.  No serosanguineous fluid drainage from ears or nares. ENT:      Ears:       Nose: No congestion/rhinnorhea.      Mouth/Throat: Mucous membranes are moist.  Neck: No stridor.  No cervical spine tenderness to palpation.  Cardiovascular: Normal rate, regular rhythm. Normal S1 and S2.  Good peripheral circulation. Respiratory: Normal respiratory effort without tachypnea or retractions. Lungs CTAB. Good air entry to the bases with no decreased or absent breath sounds. Musculoskeletal: Full range of motion to all extremities. No gross deformities appreciated. Neurologic:  Normal speech and language. No gross focal neurologic deficits are appreciated.  Cranial nerves II through XII grossly intact Skin:  Skin is warm, dry and intact. No rash noted. Psychiatric: Mood and affect are normal. Speech and behavior are normal. Patient exhibits appropriate insight and judgement.   ____________________________________________   LABS (all labs ordered are listed, but only abnormal results are displayed)  Labs Reviewed - No data to display ____________________________________________  EKG   ____________________________________________  RADIOLOGY I personally viewed and evaluated these images as part of my medical decision making, as well as reviewing the written report by the radiologist.  ED Provider Interpretation: Concur with radiologist finding with no acute traumatic findings  CT Head Wo Contrast  Result Date: 04/16/2020 CLINICAL DATA:  Recent fall with headaches, initial encounter EXAM: CT HEAD WITHOUT CONTRAST  TECHNIQUE: Contiguous axial images were obtained from the base of the skull through the vertex without intravenous contrast. COMPARISON:  None. FINDINGS: Brain: No evidence of acute infarction, hemorrhage, hydrocephalus, extra-axial collection or mass lesion/mass effect. Vascular: No hyperdense vessel or unexpected calcification. Skull: Normal. Negative for fracture or focal lesion. Sinuses/Orbits: No acute finding. Other: None. IMPRESSION: No acute abnormality noted. Electronically Signed   By: Alcide Clever M.D.   On: 04/16/2020 19:41    ____________________________________________    PROCEDURES  Procedure(s) performed:    Procedures    Medications - No data to display   ____________________________________________   INITIAL IMPRESSION / ASSESSMENT AND PLAN / ED COURSE  Pertinent labs & imaging results that were available during my care of the patient were reviewed by me and considered in my medical decision making (see chart for details).  Review of the  [place] was performed in accordance of the [facility]  prior to dispensing any controlled drugs.           Patient's diagnosis is consistent with fall, minor head injury.  Patient presented to the emergency department after falling down a flight of stairs.  She had other musculoskeletal complaints that were evaluated in urgent care with negative imaging.  Patient had been referred to the emergency department for CT scan to ensure no fracture or intracranial hemorrhage.  Neuro exam is reassuring.  Low concern for concussion.  Imaging reveals no acute traumatic findings.  Patient may use prescribed medications from urgent care for symptom relief of her musculoskeletal pain.  Return precautions discussed with the patient.  Patient stable for discharge at this time..  Patient is given ED precautions to return to the ED for any worsening or new symptoms.     ____________________________________________  FINAL CLINICAL IMPRESSION(S) / ED  DIAGNOSES  Final diagnoses:  Fall, initial encounter  Minor head injury, initial encounter      NEW MEDICATIONS STARTED DURING THIS VISIT:  ED Discharge Orders    None          This chart was dictated using voice recognition software/Dragon. Despite best efforts to proofread, errors can occur which can change the meaning. Any change was purely unintentional.    Racheal Patches, PA-C 04/16/20 2140    Delton Prairie, MD 04/16/20 [phone]

## 2020-04-17 ENCOUNTER — Telehealth: Payer: Self-pay | Admitting: *Deleted

## 2020-04-17 ENCOUNTER — Ambulatory Visit: Payer: Self-pay | Admitting: Family Medicine

## 2020-04-17 NOTE — Telephone Encounter (Signed)
Transition Care Management Follow-up Telephone Call  Date of discharge and from where: 04/16/2020 [facility] ED  How have you been since you were released from the hospital? "Really sore since falling down the stairs"  Any questions or concerns? No  Items Reviewed:  Did the pt receive and understand the discharge instructions provided? Yes   Medications obtained and verified? Yes   Other? N/A  Any new allergies since your discharge? No   Dietary orders reviewed? Yes  Do you have support at home? Yes   Home Care and Equipment/Supplies: Were home health services ordered? not applicable If so, what is the name of the agency? N/A  Has the agency set up a time to come to the patient's home? not applicable Were any new equipment or medical supplies ordered?  No What is the name of the medical supply agency? N/A Were you able to get the supplies/equipment? not applicable Do you have any questions related to the use of the equipment or supplies? No  Functional Questionnaire: (I = Independent and D = Dependent) ADLs: I  Bathing/Dressing- I  Meal Prep- I  Eating- I  Maintaining continence- I  Transferring/Ambulation- I  Managing Meds- I  Follow up appointments reviewed:   PCP Hospital f/u appt confirmed? Yes  Scheduled to see PCP on 04/17/2020 @ 1600.  Specialist Hospital f/u appt confirmed? Patient states that she is going to make an appointment to see Ortho next week if her pain is still present.   Are transportation arrangements needed? No   If their condition worsens, is the pt aware to call PCP or go to the Emergency Dept.? Yes  Was the patient provided with contact information for the PCP's office or ED? Yes  Was to pt encouraged to call back with questions or concerns? Yes

## 2020-07-20 ENCOUNTER — Other Ambulatory Visit: Payer: Self-pay | Admitting: Obstetrics and Gynecology

## 2020-07-20 DIAGNOSIS — Z1231 Encounter for screening mammogram for malignant neoplasm of breast: Secondary | ICD-10-CM

## 2020-07-22 ENCOUNTER — Other Ambulatory Visit: Payer: Self-pay | Admitting: Orthopedic Surgery

## 2020-08-06 ENCOUNTER — Encounter
Admission: RE | Admit: 2020-08-06 | Discharge: 2020-08-06 | Disposition: A | Discharge status: Home / Self Care | Payer: BC Managed Care – PPO | Source: Ambulatory Visit | Attending: Orthopedic Surgery | Admitting: Orthopedic Surgery

## 2020-08-06 ENCOUNTER — Other Ambulatory Visit: Payer: Self-pay

## 2020-08-06 DIAGNOSIS — Z01818 Encounter for other preprocedural examination: Secondary | ICD-10-CM | POA: Insufficient documentation

## 2020-08-06 HISTORY — DX: Other specified postprocedural states: Z98.890

## 2020-08-06 HISTORY — DX: Nausea with vomiting, unspecified: R11.2

## 2020-08-06 LAB — TYPE AND SCREEN
ABO/RH(D): O POS
Antibody Screen: NEGATIVE

## 2020-08-06 LAB — COMPREHENSIVE METABOLIC PANEL
ALT: 20 U/L (ref 0–44)
AST: 17 U/L (ref 15–41)
Albumin: 3.7 g/dL (ref 3.5–5.0)
Alkaline Phosphatase: 63 U/L (ref 38–126)
Anion gap: 10 (ref 5–15)
BUN: 15 mg/dL (ref 6–20)
CO2: 25 mmol/L (ref 22–32)
Calcium: 8.9 mg/dL (ref 8.9–10.3)
Chloride: 100 mmol/L (ref 98–111)
Creatinine, Ser: 0.86 mg/dL (ref 0.44–1.00)
GFR, Estimated: >60 mL/min (ref >60)
Glucose, Bld: 101 mg/dL — ABNORMAL HIGH (ref 70–99)
Potassium: 4.2 mmol/L (ref 3.5–5.1)
Sodium: 135 mmol/L (ref 135–145)
Total Bilirubin: 0.6 mg/dL (ref 0.3–1.2)
Total Protein: 7 g/dL (ref 6.5–8.1)

## 2020-08-06 LAB — URINALYSIS, ROUTINE W REFLEX MICROSCOPIC
Bilirubin Urine: NEGATIVE
Glucose, UA: NEGATIVE mg/dL
Ketones, ur: NEGATIVE mg/dL
Leukocytes,Ua: NEGATIVE
Nitrite: NEGATIVE
Protein, ur: NEGATIVE mg/dL
Specific Gravity, Urine: 1.011 (ref 1.005–1.030)
pH: 5 (ref 5.0–8.0)

## 2020-08-06 LAB — CBC WITH DIFFERENTIAL/PLATELET
Abs Immature Granulocytes: 0.03 10*3/uL (ref 0.00–0.07)
Basophils Absolute: 0.1 10*3/uL (ref 0.0–0.1)
Basophils Relative: 1 %
Eosinophils Absolute: 0.3 10*3/uL (ref 0.0–0.5)
Eosinophils Relative: 4 %
HCT: 42.8 % (ref 36.0–46.0)
Hemoglobin: 14.1 g/dL (ref 12.0–15.0)
Immature Granulocytes: 0 %
Lymphocytes Relative: 36 %
Lymphs Abs: 2.9 10*3/uL (ref 0.7–4.0)
MCH: 27 pg (ref 26.0–34.0)
MCHC: 32.9 g/dL (ref 30.0–36.0)
MCV: 82 fL (ref 80.0–100.0)
Monocytes Absolute: 0.5 10*3/uL (ref 0.1–1.0)
Monocytes Relative: 6 %
Neutro Abs: 4.4 10*3/uL (ref 1.7–7.7)
Neutrophils Relative %: 53 %
Platelets: 276 10*3/uL (ref 150–400)
RBC: 5.22 MIL/uL — ABNORMAL HIGH (ref 3.87–5.11)
RDW: 14.1 % (ref 11.5–15.5)
WBC: 8.2 10*3/uL (ref 4.0–10.5)
nRBC: 0 % (ref 0.0–0.2)

## 2020-08-06 LAB — SURGICAL PCR SCREEN
MRSA, PCR: NEGATIVE
Staphylococcus aureus: NEGATIVE

## 2020-08-06 NOTE — Patient Instructions (Addendum)
Your procedure is scheduled on:  Thursday, June 9 Report to the Registration Desk on the 1st floor of the [facility] To find out your arrival time, please call [phone] between 1PM - 3PM on: Wednesday, June 8  REMEMBER: Instructions that are not followed completely may result in serious medical risk, up to and including death; or upon the discretion of your surgeon and anesthesiologist your surgery may need to be rescheduled.  Do not eat food after midnight the night before surgery.  No gum chewing, lozengers or hard candies.  You may however, drink CLEAR liquids up to 2 hours before you are scheduled to arrive for your surgery. Do not drink anything within 2 hours of your scheduled arrival time.  Clear liquids include: - water  - apple juice without pulp - gatorade (not RED, PURPLE, OR BLUE) - black coffee or tea (Do NOT add milk or creamers to the coffee or tea) Do NOT drink anything that is not on this list.  In addition, your doctor has ordered for you to drink the provided  Ensure Pre-Surgery Clear Carbohydrate Drink  Drinking this carbohydrate drink up to two hours before surgery helps to reduce insulin resistance and improve patient outcomes. Please complete drinking 2 hours prior to scheduled arrival time.  TAKE THESE MEDICATIONS THE MORNING OF SURGERY WITH A SIP OF WATER:  1.  Hydrocodone if needed for pain 2.  Alprazolam if needed for anxiety  One week prior to surgery:  Starting June 2 Stop Anti-inflammatories (NSAIDS) such as Advil, Aleve, Ibuprofen, Motrin, Naproxen, Naprosyn and Aspirin based products such as Excedrin, Goodys Powder, BC Powder. Stop ANY OVER THE COUNTER supplements until after surgery. You may however, continue to take Tylenol if needed for pain up until the day of surgery.  No Alcohol for 24 hours before or after surgery.  No Smoking including e-cigarettes for 24 hours prior to surgery.  No chewable tobacco products for at least 6 hours  prior to surgery.  No nicotine patches on the day of surgery.  Do not use any "recreational" drugs for at least a week prior to your surgery.  Please be advised that the combination of cocaine and anesthesia may have negative outcomes, up to and including death. If you test positive for cocaine, your surgery will be cancelled.  On the morning of surgery brush your teeth with toothpaste and water, you may rinse your mouth with mouthwash if you wish. Do not swallow any toothpaste or mouthwash.  Do not wear jewelry, make-up, hairpins, clips or nail polish.  Do not wear lotions, powders, or perfumes.   Do not shave body from the neck down 48 hours prior to surgery just in case you cut yourself which could leave a site for infection.  Also, freshly shaved skin may become irritated if using the CHG soap.  Contact lenses, hearing aids and dentures may not be worn into surgery.  Do not bring valuables to the hospital. [facility] is not responsible for any missing/lost belongings or valuables.   Use CHG Soap as directed on instruction sheet.  Notify your doctor if there is any change in your medical condition (cold, fever, infection).  Wear comfortable clothing (specific to your surgery type) to the hospital.  Plan for stool softeners for home use; pain medications have a tendency to cause constipation. You can also help prevent constipation by eating foods high in fiber such as fruits and vegetables and drinking plenty of fluids as your diet allows.  After surgery, you can help prevent lung complications by doing breathing exercises.  Take deep breaths and cough every 1-2 hours. Your doctor may order a device called an Incentive Spirometer to help you take deep breaths.  If you are being admitted to the hospital overnight, leave your suitcase in the car. After surgery it may be brought to your room.  Please call the Pre-admissions Testing Dept. at [phone] if you have any questions  about these instructions.  Surgery Visitation Policy:  Patients undergoing a surgery or procedure may have one family member or support person with them as long as that person is not COVID-19 positive or experiencing its symptoms.  That person may remain in the waiting area during the procedure.  Inpatient Visitation:    Visiting hours are 7 a.m. to 8 p.m. Inpatients will be allowed two visitors daily. The visitors may change each day during the patient's stay. No visitors under the age of 70. Any visitor under the age of 36 must be accompanied by an adult. The visitor must pass COVID-19 screenings, use hand sanitizer when entering and exiting the patient's room and wear a mask at all times, including in the patient's room. Patients must also wear a mask when staff or their visitor are in the room. Masking is required regardless of vaccination status.  Total Hip/Knee Replacement Preoperative Educational Video  To better prepare for surgery, please view our videos that explain the physical activity and discharge planning required to have the best surgical recovery at [facility].  [email or web address]      Questions? Call [phone] or email [email or web address]

## 2020-08-07 ENCOUNTER — Inpatient Hospital Stay: Admission: RE | Admit: 2020-08-07 | Payer: Self-pay | Source: Ambulatory Visit

## 2020-08-07 ENCOUNTER — Other Ambulatory Visit: Payer: Self-pay | Admitting: *Deleted

## 2020-08-07 MED ORDER — ALPRAZOLAM 0.25 MG PO TABS
0.2500 mg | ORAL_TABLET | Freq: Two times a day (BID) | ORAL | 0 refills | Status: DC | PRN
Start: 2020-08-07 — End: 2020-12-09

## 2020-08-18 ENCOUNTER — Other Ambulatory Visit: Payer: Self-pay

## 2020-08-18 ENCOUNTER — Other Ambulatory Visit
Admission: RE | Admit: 2020-08-18 | Discharge: 2020-08-18 | Disposition: A | Discharge status: Home / Self Care | Payer: BC Managed Care – PPO | Source: Ambulatory Visit | Attending: Orthopedic Surgery | Admitting: Orthopedic Surgery

## 2020-08-18 DIAGNOSIS — Z20822 Contact with and (suspected) exposure to covid-19: Secondary | ICD-10-CM | POA: Diagnosis not present

## 2020-08-18 DIAGNOSIS — Z01812 Encounter for preprocedural laboratory examination: Secondary | ICD-10-CM | POA: Insufficient documentation

## 2020-08-18 LAB — SARS CORONAVIRUS 2 (TAT 6-24 HRS): SARS Coronavirus 2: NEGATIVE

## 2020-08-20 ENCOUNTER — Other Ambulatory Visit: Payer: Self-pay

## 2020-08-20 ENCOUNTER — Ambulatory Visit: Payer: BC Managed Care – PPO

## 2020-08-20 ENCOUNTER — Observation Stay
Admission: RE | Admit: 2020-08-20 | Discharge: 2020-08-21 | Disposition: A | Discharge status: Home / Self Care | Payer: BC Managed Care – PPO | Attending: Orthopedic Surgery | Admitting: Orthopedic Surgery

## 2020-08-20 ENCOUNTER — Ambulatory Visit: Payer: BC Managed Care – PPO | Admitting: Anesthesiology

## 2020-08-20 ENCOUNTER — Observation Stay: Payer: BC Managed Care – PPO

## 2020-08-20 ENCOUNTER — Encounter: Payer: Self-pay | Admitting: Orthopedic Surgery

## 2020-08-20 ENCOUNTER — Encounter
Admission: RE | Disposition: A | Discharge status: Home / Self Care | Payer: Self-pay | Source: Home / Self Care | Attending: Orthopedic Surgery

## 2020-08-20 DIAGNOSIS — J449 Chronic obstructive pulmonary disease, unspecified: Secondary | ICD-10-CM | POA: Diagnosis not present

## 2020-08-20 DIAGNOSIS — M1611 Unilateral primary osteoarthritis, right hip: Principal | ICD-10-CM | POA: Insufficient documentation

## 2020-08-20 DIAGNOSIS — Z96642 Presence of left artificial hip joint: Secondary | ICD-10-CM | POA: Diagnosis not present

## 2020-08-20 DIAGNOSIS — G8918 Other acute postprocedural pain: Secondary | ICD-10-CM

## 2020-08-20 DIAGNOSIS — I10 Essential (primary) hypertension: Secondary | ICD-10-CM | POA: Insufficient documentation

## 2020-08-20 DIAGNOSIS — Z419 Encounter for procedure for purposes other than remedying health state, unspecified: Secondary | ICD-10-CM

## 2020-08-20 DIAGNOSIS — Z96649 Presence of unspecified artificial hip joint: Secondary | ICD-10-CM

## 2020-08-20 HISTORY — PX: TOTAL HIP ARTHROPLASTY: SHX124

## 2020-08-20 LAB — CBC
HCT: 40.4 % (ref 36.0–46.0)
Hemoglobin: 12.9 g/dL (ref 12.0–15.0)
MCH: 26.6 pg (ref 26.0–34.0)
MCHC: 31.9 g/dL (ref 30.0–36.0)
MCV: 83.3 fL (ref 80.0–100.0)
Platelets: 263 10*3/uL (ref 150–400)
RBC: 4.85 MIL/uL (ref 3.87–5.11)
RDW: 14.2 % (ref 11.5–15.5)
WBC: 13.3 10*3/uL — ABNORMAL HIGH (ref 4.0–10.5)
nRBC: 0 % (ref 0.0–0.2)

## 2020-08-20 LAB — CREATININE, SERUM
Creatinine, Ser: 0.93 mg/dL (ref 0.44–1.00)
GFR, Estimated: >60 mL/min (ref >60)

## 2020-08-20 SURGERY — ARTHROPLASTY, HIP, TOTAL, ANTERIOR APPROACH
Anesthesia: General | Site: Hip | Laterality: Right

## 2020-08-20 MED ORDER — GABAPENTIN 300 MG PO CAPS: 300.0000 mg | ORAL_CAPSULE | Freq: Every evening | ORAL | Status: DC | PRN

## 2020-08-20 MED ORDER — PHENOL 1.4 % MT LIQD
1.0000 | OROMUCOSAL | Status: DC | PRN
Start: 2020-08-20 — End: 2020-08-21
  Filled 2020-08-20: qty 177

## 2020-08-20 MED ORDER — ACETAMINOPHEN 10 MG/ML IV SOLN
INTRAVENOUS | Status: DC | PRN
  Administered 2020-08-20: 1000 mg via INTRAVENOUS

## 2020-08-20 MED ORDER — MAGNESIUM CITRATE PO SOLN
1.0000 | Freq: Once | ORAL | Status: DC | PRN
  Filled 2020-08-20: qty 296

## 2020-08-20 MED ORDER — ONDANSETRON HCL 4 MG/2ML IJ SOLN
INTRAMUSCULAR | Status: AC
  Filled 2020-08-20: qty 2

## 2020-08-20 MED ORDER — ORAL CARE MOUTH RINSE: 15.0000 mL | Freq: Once | OROMUCOSAL | Status: AC

## 2020-08-20 MED ORDER — ACETAMINOPHEN 500 MG PO TABS
1000.0000 mg | ORAL_TABLET | Freq: Four times a day (QID) | ORAL | Status: AC
  Administered 2020-08-20 – 2020-08-21 (×4): 1000 mg via ORAL
  Filled 2020-08-20 (×4): qty 2

## 2020-08-20 MED ORDER — DIPHENHYDRAMINE HCL 50 MG/ML IJ SOLN
INTRAMUSCULAR | Status: DC | PRN
  Administered 2020-08-20: 12.5 mg via INTRAVENOUS

## 2020-08-20 MED ORDER — DIPHENHYDRAMINE HCL 50 MG/ML IJ SOLN
INTRAMUSCULAR | Status: AC
  Filled 2020-08-20: qty 1

## 2020-08-20 MED ORDER — FENTANYL CITRATE (PF) 100 MCG/2ML IJ SOLN
25.0000 ug | INTRAMUSCULAR | Status: DC | PRN
  Administered 2020-08-20 (×2): 50 ug via INTRAVENOUS

## 2020-08-20 MED ORDER — ZOLPIDEM TARTRATE 5 MG PO TABS: 5.0000 mg | ORAL_TABLET | Freq: Every evening | ORAL | Status: DC | PRN

## 2020-08-20 MED ORDER — MIDAZOLAM HCL 2 MG/2ML IJ SOLN
INTRAMUSCULAR | Status: AC
  Filled 2020-08-20: qty 2

## 2020-08-20 MED ORDER — ACETAMINOPHEN 325 MG PO TABS: 325.0000 mg | ORAL_TABLET | Freq: Four times a day (QID) | ORAL | Status: DC | PRN

## 2020-08-20 MED ORDER — ONDANSETRON HCL 4 MG/2ML IJ SOLN
INTRAMUSCULAR | Status: DC | PRN
  Administered 2020-08-20: 4 mg via INTRAVENOUS

## 2020-08-20 MED ORDER — METHOCARBAMOL 500 MG PO TABS
500.0000 mg | ORAL_TABLET | Freq: Four times a day (QID) | ORAL | Status: DC | PRN
  Administered 2020-08-21: 500 mg via ORAL
  Filled 2020-08-20: qty 1

## 2020-08-20 MED ORDER — PROPOFOL 10 MG/ML IV BOLUS
INTRAVENOUS | Status: DC | PRN
  Administered 2020-08-20: 150 mg via INTRAVENOUS

## 2020-08-20 MED ORDER — PROPOFOL 1000 MG/100ML IV EMUL
INTRAVENOUS | Status: AC
  Filled 2020-08-20: qty 100

## 2020-08-20 MED ORDER — METOCLOPRAMIDE HCL 10 MG PO TABS: 5.0000 mg | ORAL_TABLET | Freq: Three times a day (TID) | ORAL | Status: DC | PRN

## 2020-08-20 MED ORDER — DOCUSATE SODIUM 100 MG PO CAPS
100.0000 mg | ORAL_CAPSULE | Freq: Two times a day (BID) | ORAL | Status: DC
  Administered 2020-08-20 – 2020-08-21 (×2): 100 mg via ORAL
  Filled 2020-08-20 (×2): qty 1

## 2020-08-20 MED ORDER — CHLORHEXIDINE GLUCONATE 0.12 % MT SOLN
OROMUCOSAL | Status: AC
  Administered 2020-08-20: 15 mL via OROMUCOSAL
  Filled 2020-08-20: qty 15

## 2020-08-20 MED ORDER — PHENYLEPHRINE HCL (PRESSORS) 10 MG/ML IV SOLN
INTRAVENOUS | Status: DC | PRN
  Administered 2020-08-20 (×7): 100 ug via INTRAVENOUS

## 2020-08-20 MED ORDER — LACTATED RINGERS IV SOLN: INTRAVENOUS | Status: DC

## 2020-08-20 MED ORDER — FENTANYL CITRATE (PF) 250 MCG/5ML IJ SOLN
INTRAMUSCULAR | Status: AC
  Filled 2020-08-20: qty 5

## 2020-08-20 MED ORDER — CEFAZOLIN IN SODIUM CHLORIDE 3-0.9 GM/100ML-% IV SOLN
3.0000 g | INTRAVENOUS | Status: AC
  Administered 2020-08-20: 3 g via INTRAVENOUS
  Filled 2020-08-20: qty 100

## 2020-08-20 MED ORDER — CHLORHEXIDINE GLUCONATE 0.12 % MT SOLN: 15.0000 mL | Freq: Once | OROMUCOSAL | Status: AC

## 2020-08-20 MED ORDER — HYDROMORPHONE HCL 1 MG/ML IJ SOLN: 0.5000 mg | INTRAMUSCULAR | Status: DC | PRN

## 2020-08-20 MED ORDER — OXYCODONE HCL 5 MG PO TABS
5.0000 mg | ORAL_TABLET | ORAL | Status: DC | PRN
  Administered 2020-08-20 – 2020-08-21 (×2): 5 mg via ORAL
  Administered 2020-08-21: 10 mg via ORAL
  Filled 2020-08-20: qty 1
  Filled 2020-08-20 (×2): qty 2
  Filled 2020-08-20: qty 1

## 2020-08-20 MED ORDER — DEXAMETHASONE SODIUM PHOSPHATE 10 MG/ML IJ SOLN
INTRAMUSCULAR | Status: AC
  Filled 2020-08-20: qty 1

## 2020-08-20 MED ORDER — OXYCODONE HCL 5 MG/5ML PO SOLN: 5.0000 mg | Freq: Once | ORAL | Status: AC | PRN

## 2020-08-20 MED ORDER — SUGAMMADEX SODIUM 500 MG/5ML IV SOLN
INTRAVENOUS | Status: DC | PRN
  Administered 2020-08-20: 300 mg via INTRAVENOUS

## 2020-08-20 MED ORDER — BUPIVACAINE-EPINEPHRINE 0.25% -1:200000 IJ SOLN
INTRAMUSCULAR | Status: DC | PRN
  Administered 2020-08-20: 30 mL

## 2020-08-20 MED ORDER — FENTANYL CITRATE (PF) 100 MCG/2ML IJ SOLN
INTRAMUSCULAR | Status: DC | PRN
  Administered 2020-08-20: 50 ug via INTRAVENOUS
  Administered 2020-08-20: 100 ug via INTRAVENOUS
  Administered 2020-08-20 (×2): 50 ug via INTRAVENOUS

## 2020-08-20 MED ORDER — SUGAMMADEX SODIUM 500 MG/5ML IV SOLN
INTRAVENOUS | Status: AC
  Filled 2020-08-20: qty 5

## 2020-08-20 MED ORDER — DEXAMETHASONE SODIUM PHOSPHATE 10 MG/ML IJ SOLN
INTRAMUSCULAR | Status: DC | PRN
  Administered 2020-08-20: 10 mg via INTRAVENOUS

## 2020-08-20 MED ORDER — BUPIVACAINE LIPOSOME 1.3 % IJ SUSP
INTRAMUSCULAR | Status: AC
  Filled 2020-08-20: qty 20

## 2020-08-20 MED ORDER — OXYCODONE HCL 5 MG PO TABS: 5.0000 mg | ORAL_TABLET | Freq: Once | ORAL | Status: AC | PRN

## 2020-08-20 MED ORDER — OXYCODONE HCL 5 MG PO TABS
ORAL_TABLET | ORAL | Status: AC
  Administered 2020-08-20: 5 mg via ORAL
  Filled 2020-08-20: qty 1

## 2020-08-20 MED ORDER — ALPRAZOLAM 0.25 MG PO TABS: 0.2500 mg | ORAL_TABLET | Freq: Two times a day (BID) | ORAL | Status: DC | PRN

## 2020-08-20 MED ORDER — SODIUM CHLORIDE 0.9 % IV SOLN: INTRAVENOUS | Status: DC

## 2020-08-20 MED ORDER — BUPIVACAINE-EPINEPHRINE (PF) 0.25% -1:200000 IJ SOLN
INTRAMUSCULAR | Status: AC
  Filled 2020-08-20: qty 30

## 2020-08-20 MED ORDER — NEOMYCIN-POLYMYXIN B GU 40-200000 IR SOLN
Status: DC | PRN
  Administered 2020-08-20: 2 mL

## 2020-08-20 MED ORDER — SODIUM CHLORIDE 0.9 % IV SOLN
INTRAVENOUS | Status: DC | PRN
  Administered 2020-08-20: 60 mL

## 2020-08-20 MED ORDER — SUCCINYLCHOLINE CHLORIDE 20 MG/ML IJ SOLN
INTRAMUSCULAR | Status: DC | PRN
  Administered 2020-08-20: 200 mg via INTRAVENOUS

## 2020-08-20 MED ORDER — ALUM & MAG HYDROXIDE-SIMETH 200-200-20 MG/5ML PO SUSP: 30.0000 mL | ORAL | Status: DC | PRN

## 2020-08-20 MED ORDER — METOCLOPRAMIDE HCL 5 MG/ML IJ SOLN: 5.0000 mg | Freq: Three times a day (TID) | INTRAMUSCULAR | Status: DC | PRN

## 2020-08-20 MED ORDER — FAMOTIDINE 20 MG PO TABS
ORAL_TABLET | ORAL | Status: AC
  Administered 2020-08-20: 20 mg via ORAL
  Filled 2020-08-20: qty 1

## 2020-08-20 MED ORDER — FENTANYL CITRATE (PF) 100 MCG/2ML IJ SOLN
INTRAMUSCULAR | Status: AC
  Administered 2020-08-20: 50 ug via INTRAVENOUS
  Filled 2020-08-20: qty 2

## 2020-08-20 MED ORDER — ENOXAPARIN SODIUM 40 MG/0.4ML IJ SOSY
40.0000 mg | PREFILLED_SYRINGE | INTRAMUSCULAR | Status: DC
  Administered 2020-08-21: 40 mg via SUBCUTANEOUS
  Filled 2020-08-20: qty 0.4

## 2020-08-20 MED ORDER — OXYCODONE HCL 5 MG PO TABS
10.0000 mg | ORAL_TABLET | ORAL | Status: DC | PRN
  Administered 2020-08-21: 10 mg via ORAL
  Filled 2020-08-20: qty 3

## 2020-08-20 MED ORDER — SUCCINYLCHOLINE CHLORIDE 200 MG/10ML IV SOSY
PREFILLED_SYRINGE | INTRAVENOUS | Status: AC
  Filled 2020-08-20: qty 10

## 2020-08-20 MED ORDER — FAMOTIDINE 20 MG PO TABS: 20.0000 mg | ORAL_TABLET | Freq: Once | ORAL | Status: AC

## 2020-08-20 MED ORDER — PROMETHAZINE HCL 25 MG/ML IJ SOLN: 6.2500 mg | INTRAMUSCULAR | Status: DC | PRN

## 2020-08-20 MED ORDER — NEOMYCIN-POLYMYXIN B GU 40-200000 IR SOLN
Status: AC
  Filled 2020-08-20: qty 4

## 2020-08-20 MED ORDER — ONDANSETRON HCL 4 MG PO TABS: 4.0000 mg | ORAL_TABLET | Freq: Four times a day (QID) | ORAL | Status: DC | PRN

## 2020-08-20 MED ORDER — POLYETHYLENE GLYCOL 3350 17 G PO PACK: 17.0000 g | PACK | Freq: Every day | ORAL | Status: DC | PRN

## 2020-08-20 MED ORDER — SODIUM CHLORIDE FLUSH 0.9 % IV SOLN
INTRAVENOUS | Status: AC
  Filled 2020-08-20: qty 40

## 2020-08-20 MED ORDER — LIDOCAINE HCL (CARDIAC) PF 100 MG/5ML IV SOSY
PREFILLED_SYRINGE | INTRAVENOUS | Status: DC | PRN
  Administered 2020-08-20: 100 mg via INTRAVENOUS

## 2020-08-20 MED ORDER — PANTOPRAZOLE SODIUM 40 MG PO TBEC
40.0000 mg | DELAYED_RELEASE_TABLET | Freq: Every day | ORAL | Status: DC
  Administered 2020-08-21: 40 mg via ORAL
  Filled 2020-08-20: qty 1

## 2020-08-20 MED ORDER — TRAMADOL HCL 50 MG PO TABS
50.0000 mg | ORAL_TABLET | Freq: Four times a day (QID) | ORAL | Status: DC
  Administered 2020-08-20 – 2020-08-21 (×5): 50 mg via ORAL
  Filled 2020-08-20 (×5): qty 1

## 2020-08-20 MED ORDER — MIDAZOLAM HCL 2 MG/2ML IJ SOLN
INTRAMUSCULAR | Status: DC | PRN
  Administered 2020-08-20: 2 mg via INTRAVENOUS

## 2020-08-20 MED ORDER — ACETAMINOPHEN 10 MG/ML IV SOLN
INTRAVENOUS | Status: AC
  Filled 2020-08-20: qty 100

## 2020-08-20 MED ORDER — METHOCARBAMOL 1000 MG/10ML IJ SOLN
500.0000 mg | Freq: Four times a day (QID) | INTRAMUSCULAR | Status: DC | PRN
  Filled 2020-08-20: qty 5

## 2020-08-20 MED ORDER — MENTHOL 3 MG MT LOZG
1.0000 | LOZENGE | OROMUCOSAL | Status: DC | PRN
  Filled 2020-08-20: qty 9

## 2020-08-20 MED ORDER — ONDANSETRON HCL 4 MG/2ML IJ SOLN: 4.0000 mg | Freq: Four times a day (QID) | INTRAMUSCULAR | Status: DC | PRN

## 2020-08-20 MED ORDER — DIPHENHYDRAMINE HCL 12.5 MG/5ML PO ELIX: 12.5000 mg | ORAL_SOLUTION | ORAL | Status: DC | PRN

## 2020-08-20 MED ORDER — ROCURONIUM BROMIDE 10 MG/ML (PF) SYRINGE
PREFILLED_SYRINGE | INTRAVENOUS | Status: AC
  Filled 2020-08-20: qty 10

## 2020-08-20 MED ORDER — CEFAZOLIN SODIUM-DEXTROSE 2-4 GM/100ML-% IV SOLN
2.0000 g | Freq: Four times a day (QID) | INTRAVENOUS | Status: AC
  Administered 2020-08-20 (×2): 2 g via INTRAVENOUS
  Filled 2020-08-20 (×2): qty 100

## 2020-08-20 MED ORDER — BISACODYL 10 MG RE SUPP: 10.0000 mg | Freq: Every day | RECTAL | Status: DC | PRN

## 2020-08-20 MED ORDER — ROCURONIUM BROMIDE 100 MG/10ML IV SOLN
INTRAVENOUS | Status: DC | PRN
  Administered 2020-08-20: 50 mg via INTRAVENOUS
  Administered 2020-08-20: 30 mg via INTRAVENOUS

## 2020-08-20 MED ORDER — LIDOCAINE HCL (PF) 2 % IJ SOLN
INTRAMUSCULAR | Status: AC
  Filled 2020-08-20: qty 5

## 2020-08-20 SURGICAL SUPPLY — 61 items
BLADE SAGITTAL AGGR TOOTH XLG (BLADE) ×3 IMPLANT
BNDG COHESIVE 6X5 TAN STRL LF (GAUZE/BANDAGES/DRESSINGS) ×9 IMPLANT
CANISTER SUCT 1200ML W/VALVE (MISCELLANEOUS) ×3 IMPLANT
CANISTER WOUND CARE 500ML ATS (WOUND CARE) ×3 IMPLANT
CHLORAPREP W/TINT 26 (MISCELLANEOUS) ×3 IMPLANT
COVER BACK TABLE REUSABLE LG (DRAPES) ×3 IMPLANT
COVER WAND RF STERILE (DRAPES) ×3 IMPLANT
CUP ACETAB VERSA DBL 28X58 DMI (Orthopedic Implant) ×3 IMPLANT
DRAPE 3/4 80X56 (DRAPES) ×9 IMPLANT
DRAPE C-ARM XRAY 36X54 (DRAPES) ×3 IMPLANT
DRAPE INCISE IOBAN 66X60 STRL (DRAPES) IMPLANT
DRAPE POUCH INSTRU U-SHP 10X18 (DRAPES) ×3 IMPLANT
DRESSING SURGICEL FIBRLLR 1X2 (HEMOSTASIS) ×2 IMPLANT
DRSG MEPILEX SACRM 8.7X9.8 (GAUZE/BANDAGES/DRESSINGS) ×3 IMPLANT
DRSG OPSITE POSTOP 4X8 (GAUZE/BANDAGES/DRESSINGS) ×6 IMPLANT
DRSG SURGICEL FIBRILLAR 1X2 (HEMOSTASIS) ×6
ELECT BLADE 6.5 EXT (BLADE) ×3 IMPLANT
ELECT REM PT RETURN 9FT ADLT (ELECTROSURGICAL) ×3
ELECTRODE REM PT RTRN 9FT ADLT (ELECTROSURGICAL) ×1 IMPLANT
GLOVE SURG SYN 9.0  PF PI (GLOVE) ×4
GLOVE SURG SYN 9.0 PF PI (GLOVE) ×2 IMPLANT
GLOVE SURG UNDER POLY LF SZ9 (GLOVE) ×3 IMPLANT
GOWN SRG 2XL LVL 4 RGLN SLV (GOWNS) ×1 IMPLANT
GOWN STRL NON-REIN 2XL LVL4 (GOWNS) ×2
GOWN STRL REUS W/ TWL LRG LVL3 (GOWN DISPOSABLE) ×1 IMPLANT
GOWN STRL REUS W/TWL LRG LVL3 (GOWN DISPOSABLE) ×2
HEAD FEMORAL SZ28 LGE BIOLOX (Head) ×3 IMPLANT
HEMOVAC 400CC 10FR (MISCELLANEOUS) IMPLANT
HOLDER FOLEY CATH W/STRAP (MISCELLANEOUS) ×3 IMPLANT
HOOD PEEL AWAY FLYTE STAYCOOL (MISCELLANEOUS) ×3 IMPLANT
IRRIGATION SURGIPHOR STRL (IV SOLUTION) IMPLANT
KIT PREVENA INCISION MGT 13 (CANNISTER) ×3 IMPLANT
MANIFOLD NEPTUNE II (INSTRUMENTS) ×3 IMPLANT
MASTERLOC HIP LATERAL S6 (Hips) ×3 IMPLANT
MAT ABSORB  FLUID 56X50 GRAY (MISCELLANEOUS) ×2
MAT ABSORB FLUID 56X50 GRAY (MISCELLANEOUS) ×1 IMPLANT
NDL SAFETY ECLIPSE 18X1.5 (NEEDLE) ×1 IMPLANT
NEEDLE HYPO 18GX1.5 SHARP (NEEDLE) ×2
NEEDLE SPNL 20GX3.5 QUINCKE YW (NEEDLE) ×6 IMPLANT
NS IRRIG 1000ML POUR BTL (IV SOLUTION) ×3 IMPLANT
PACK HIP COMPR (MISCELLANEOUS) ×3 IMPLANT
SCALPEL PROTECTED #10 DISP (BLADE) ×6 IMPLANT
SHELL ACETABULAR SZ0 58MM (Shell) ×3 IMPLANT
SOL PREP PVP 2OZ (MISCELLANEOUS) ×3
SOLUTION PREP PVP 2OZ (MISCELLANEOUS) ×1 IMPLANT
SPONGE DRAIN TRACH 4X4 STRL 2S (GAUZE/BANDAGES/DRESSINGS) ×3 IMPLANT
STAPLER SKIN PROX 35W (STAPLE) ×3 IMPLANT
STRAP SAFETY 5IN WIDE (MISCELLANEOUS) ×3 IMPLANT
SUT DVC 2 QUILL PDO  T11 36X36 (SUTURE) ×2
SUT DVC 2 QUILL PDO T11 36X36 (SUTURE) ×1 IMPLANT
SUT SILK 0 (SUTURE) ×2
SUT SILK 0 30XBRD TIE 6 (SUTURE) ×1 IMPLANT
SUT V-LOC 90 ABS DVC 3-0 CL (SUTURE) ×3 IMPLANT
SUT VIC AB 1 CT1 36 (SUTURE) ×3 IMPLANT
SYR 20ML LL LF (SYRINGE) ×3 IMPLANT
SYR 30ML LL (SYRINGE) ×3 IMPLANT
SYR 50ML LL SCALE MARK (SYRINGE) ×6 IMPLANT
SYR BULB IRRIG 60ML STRL (SYRINGE) ×3 IMPLANT
TAPE MICROFOAM 4IN (TAPE) ×3 IMPLANT
TOWEL OR 17X26 4PK STRL BLUE (TOWEL DISPOSABLE) ×3 IMPLANT
TRAY FOLEY MTR SLVR 16FR STAT (SET/KITS/TRAYS/PACK) ×3 IMPLANT

## 2020-08-20 NOTE — Anesthesia Procedure Notes (Signed)
Procedure Name: Intubation Date/Time: 08/20/2020 7:26 AM Performed by: Irving Burton, CRNA Pre-anesthesia Checklist: Patient identified, Patient being monitored, Timeout performed, Emergency Drugs available and Suction available Patient Re-evaluated:Patient Re-evaluated prior to induction Oxygen Delivery Method: Circle system utilized Preoxygenation: Pre-oxygenation with 100% oxygen Induction Type: IV induction Ventilation: Mask ventilation without difficulty Laryngoscope Size: 3 and McGraph Grade View: Grade I Tube type: Oral Tube size: 7.0 mm Number of attempts: 1 Airway Equipment and Method: Stylet Placement Confirmation: ETT inserted through vocal cords under direct vision, positive ETCO2 and breath sounds checked- equal and bilateral Secured at: 21 cm Tube secured with: Tape Dental Injury: Teeth and Oropharynx as per pre-operative assessment

## 2020-08-20 NOTE — Evaluation (Signed)
Physical Therapy Evaluation Patient Details Name: Kimberly Nixon MRN: [id number] DOB: April 01, 1965 Today's Date: 08/20/2020   History of Present Illness  Pt is a 55 yo female s/p R THA, WBAT. PMH of COPD, HTN, L THA.  Clinical Impression  Patient alert, agreeable to PT. Reported 4/10 in R hip pain. Patient stated at baseline she is independent.  The patient was able to perform supine exercises with verbal and tactile cueing. Performed bed mobility with supervision. Sit <> stand with RW and CGA, the patient was able to ambulate ~93ft with RW and CGA. Pt with reciprocal gait pattern, good weight bearing noted in R leg.  Overall the patient demonstrated deficits (see "PT Problem List") that impede the patient's functional abilities, safety, and mobility and would benefit from skilled PT intervention. Recommendation is HHPT with intermittent supervision. Pt for stair training in AM for potential discharge.      Follow Up Recommendations Home health PT;Supervision - Intermittent    Equipment Recommendations  None recommended by PT    Recommendations for Other Services       Precautions / Restrictions Precautions Precautions: Fall Restrictions Weight Bearing Restrictions: Yes RLE Weight Bearing: Weight bearing as tolerated      Mobility  Bed Mobility Overal bed mobility: Needs Assistance Bed Mobility: Supine to Sit     Supine to sit: Supervision;HOB elevated          Transfers Overall transfer level: Needs assistance Equipment used: Rolling walker (2 wheeled) Transfers: Sit to/from Stand Sit to Stand: Min guard;Supervision            Ambulation/Gait Ambulation/Gait assistance: Land (Feet): 25 Feet Assistive device: Rolling walker (2 wheeled)   Gait velocity: decreased   General Gait Details: pt with great weight bearing in LEs, reciprocal gait pattern noted  Stairs            Wheelchair Mobility    Modified Rankin (Stroke Patients  Only)       Balance Overall balance assessment: Needs assistance Sitting-balance support: Feet supported Sitting balance-Leahy Scale: Good       Standing balance-Leahy Scale: Fair                               Pertinent Vitals/Pain Pain Assessment: 0-10 Pain Score: 4  Pain Location: R hip Pain Descriptors / Indicators: Aching;Sore Pain Intervention(s): Limited activity within patient's tolerance;Monitored during session;Repositioned;Premedicated before session    Home Living Family/patient expects to be discharged to:: Private residence Living Arrangements: Other relatives (nephew, son) Available Help at Discharge: Family;Available 24 hours/day Type of Home: House Home Access: Stairs to enter   [employer] of Steps: one threshold step Home Layout: Two level Home Equipment: Walker - 2 wheels;Bedside commode      Prior Function Level of Independence: Independent         Comments: works full time as a Runner, broadcasting/film/video. 2 falls in the last 6 months, one in Feb that may have led to a concussion, and caused back and RLE pain     Hand Dominance        Extremity/Trunk Assessment   Upper Extremity Assessment Upper Extremity Assessment: Generalized weakness    Lower Extremity Assessment Lower Extremity Assessment: RLE deficits/detail;LLE deficits/detail RLE Deficits / Details: s/p R THA LLE Deficits / Details: WFLs       Communication   Communication: No difficulties  Cognition Arousal/Alertness: Awake/alert Behavior During Therapy: WFL for tasks assessed/performed Overall  Cognitive Status: Within Functional Limits for tasks assessed                                        General Comments      Exercises Total Joint Exercises Ankle Circles/Pumps: AROM;Strengthening;Both;10 reps Heel Slides: AROM;10 reps;Right Hip ABduction/ADduction: AROM;Right;10 reps   Assessment/Plan    PT Assessment Patient needs continued PT  services  PT Problem List Decreased strength;Decreased mobility;Decreased range of motion;Decreased activity tolerance;Decreased balance;Pain;Decreased knowledge of use of DME       PT Treatment Interventions DME instruction;Therapeutic exercise;Gait training;Balance training;Stair training;Neuromuscular re-education;Functional mobility training;Therapeutic activities;Patient/family education    PT Goals (Current goals can be found in the Care Plan section)  Acute Rehab PT Goals Patient Stated Goal: to go home PT Goal Formulation: With patient Time For Goal Achievement: 09/03/20 Potential to Achieve Goals: Good    Frequency BID   Barriers to discharge        Co-evaluation               AM-PAC PT "6 Clicks" Mobility  Outcome Measure Help needed turning from your back to your side while in a flat bed without using bedrails?: None Help needed moving from lying on your back to sitting on the side of a flat bed without using bedrails?: None Help needed moving to and from a bed to a chair (including a wheelchair)?: None Help needed standing up from a chair using your arms (e.g., wheelchair or bedside chair)?: None Help needed to walk in hospital room?: A Little Help needed climbing 3-5 steps with a railing? : A Little 6 Click Score: 22    End of Session Equipment Utilized During Treatment: Gait belt Activity Tolerance: Patient tolerated treatment well Patient left: in chair;with chair alarm set;with call bell/phone within reach Nurse Communication: Mobility status PT Visit Diagnosis: Other abnormalities of gait and mobility (R26.89);Muscle weakness (generalized) (M62.81);Difficulty in walking, not elsewhere classified (R26.2);Pain Pain - Right/Left: Right Pain - part of body: Hip    Time: 1610-9604 PT Time Calculation (min) (ACUTE ONLY): 29 min   Charges:   PT Evaluation $PT Eval Low Complexity: 1 Low PT Treatments $Therapeutic Exercise: 23-37 mins       Olga Coaster PT, DPT 4:22 PM,08/20/20

## 2020-08-20 NOTE — Anesthesia Preprocedure Evaluation (Addendum)
Anesthesia Evaluation  Patient identified by MRN, date of birth, ID band Patient awake    Reviewed: Allergy & Precautions, NPO status , Patient's Chart, lab work & pertinent test results  History of Anesthesia Complications (+) PONV and history of anesthetic complications  Airway Mallampati: III  TM Distance: <3 FB Neck ROM: full    Dental  (+) Chipped, Poor Dentition   Pulmonary sleep apnea , COPD, Current Smoker,    Pulmonary exam normal        Cardiovascular Exercise Tolerance: Good hypertension, Normal cardiovascular exam     Neuro/Psych  Headaches, PSYCHIATRIC DISORDERS    GI/Hepatic negative GI ROS, Neg liver ROS,   Endo/Other  negative endocrine ROS  Renal/GU      Musculoskeletal  (+) Arthritis ,   Abdominal   Peds  Hematology negative hematology ROS (+)   Anesthesia Other Findings Past Medical History: No date: Anemia No date: Anxiety No date: Cervical high risk human papillomavirus (HPV) DNA test  positive No date: Depression No date: Family history of colon cancer in father No date: Headache No date: PONV (postoperative nausea and vomiting)  Past Surgical History: 2003: ABDOMINAL HYSTERECTOMY 1997: CESAREAN SECTION 2011: CHOLECYSTECTOMY 2016, 2021: COLONOSCOPY No date: JOINT REPLACEMENT 12/18/2014: TOTAL HIP ARTHROPLASTY; Left     Comment:  Procedure: TOTAL HIP ARTHROPLASTY ANTERIOR APPROACH;                Surgeon: Kennedy Bucker, MD;  Location: [facility] ORS;  Service:              Orthopedics;  Laterality: Left;  BMI    Body Mass Index: 41.97 kg/m      Reproductive/Obstetrics negative OB ROS                             Anesthesia Physical Anesthesia Plan  ASA: 3  Anesthesia Plan: General ETT   Post-op Pain Management:    Induction: Intravenous  PONV Risk Score and Plan: Ondansetron, Dexamethasone, Midazolam and Treatment may vary due to age or medical  condition  Airway Management Planned: Oral ETT  Additional Equipment:   Intra-op Plan:   Post-operative Plan: Extubation in OR  Informed Consent: I have reviewed the patients History and Physical, chart, labs and discussed the procedure including the risks, benefits and alternatives for the proposed anesthesia with the patient or authorized representative who has indicated his/her understanding and acceptance.     Dental Advisory Given  Plan Discussed with: Anesthesiologist, CRNA and Surgeon  Anesthesia Plan Comments: (Patient reports new lumbar trauma with symptoms that radiate down into her legs so plan for GA  Patient consented for risks of anesthesia including but not limited to:  - adverse reactions to medications - damage to eyes, teeth, lips or other oral mucosa - nerve damage due to positioning  - sore throat or hoarseness - Damage to heart, brain, nerves, lungs, other parts of body or loss of life  Patient voiced understanding.)       Anesthesia Quick Evaluation

## 2020-08-20 NOTE — Anesthesia Postprocedure Evaluation (Signed)
Anesthesia Post Note  Patient: Kathie Posa  Procedure(s) Performed: TOTAL HIP ARTHROPLASTY ANTERIOR APPROACH (Right: Hip)  Patient location during evaluation: PACU Anesthesia Type: General Level of consciousness: awake and alert Pain management: pain level controlled Vital Signs Assessment: post-procedure vital signs reviewed and stable Respiratory status: spontaneous breathing, nonlabored ventilation, respiratory function stable and patient connected to nasal cannula oxygen Cardiovascular status: blood pressure returned to baseline and stable Postop Assessment: no apparent nausea or vomiting Anesthetic complications: no   No notable events documented.   Last Vitals:  Vitals:   08/20/20 1015 08/20/20 1050  BP: 111/76 118/75  Pulse: 76 71  Resp: 12 16  Temp: 36.7 C 36.5 C  SpO2: 100% 96%    Last Pain:  Vitals:   08/20/20 1050  TempSrc: Oral  PainSc:                  Cleda Mccreedy Koralynn Greenspan

## 2020-08-20 NOTE — Op Note (Signed)
08/20/2020  9:00 AM  PATIENT:  Kimberly Nixon  55 y.o. female  PRE-OPERATIVE DIAGNOSIS:  Primary osteoarthritis of right hip M16.11  POST-OPERATIVE DIAGNOSIS:  Primary osteoarthritis of right hip M16.11  PROCEDURE:  Procedure(s): TOTAL HIP ARTHROPLASTY ANTERIOR APPROACH (Right)  SURGEON: Leitha Schuller, MD  ASSISTANTS: None  ANESTHESIA:   general  EBL:  Total I/O In: 200 [IV Piggyback:200] Out: -   BLOOD ADMINISTERED:none  DRAINS:  Incisional wound VAC    LOCAL MEDICATIONS USED:  MARCAINE    and OTHER Exparel  SPECIMEN:  Source of Specimen:  f right femoral head  DISPOSITION OF SPECIMEN:  PATHOLOGY  COUNTS:  YES  TOURNIQUET:  * No tourniquets in log *  IMPLANTS: Medacta Masterloc 6 lateralized with 58 mm Mpact DM cup and liner with ceramic L 28 mm head  DICTATION: .Dragon Dictation   The patient was brought to the operating room and after spinal anesthesia was obtained patient was placed on the operative table with the ipsilateral foot into the Medacta attachment, contralateral leg on a well-padded table. C-arm was brought in and preop template x-ray taken. After prepping and draping in usual sterile fashion appropriate patient identification and timeout procedures were completed. Anterior approach to the hip was obtained and centered over the greater trochanter and TFL muscle. The subcutaneous tissue was incised hemostasis being achieved by electrocautery. TFL fascia was incised and the muscle retracted laterally deep retractor placed. The lateral femoral circumflex vessels were identified and ligated. The anterior capsule was exposed and a capsulotomy performed. The neck was identified and a femoral neck cut carried out with a saw. The head was removed without difficulty and showed sclerotic femoral head and acetabulum. Reaming was carried out to 58 mm and a 58 mm cup trial gave appropriate tightness to the acetabular component a 58 DM cup was impacted into position. The leg  was then externally rotated and ischiofemoral and pubofemoral releases carried out. The femur was sequentially broached to a size 6, size 6 lateralized with S head trials were placed and the final components chosen. The 6 lateralized stem was inserted along with a ceramic L 28 mm head and 58 mm liner. The hip was reduced and was stable the wound was thoroughly irrigated with fibrillar placed along the posterior capsule and medial neck. The deep fascia ws closed using a heavy Quill after infiltration of 30 cc of quarter percent Sensorcaine with epinephrine diluted with Exparel throughout the case .3-0 V-loc to close the skin with skin staples.  Incisional wound VAC applied and patient was sent to recovery in stable condition.   PLAN OF CARE: Admit for overnight observation

## 2020-08-20 NOTE — Transfer of Care (Signed)
Immediate Anesthesia Transfer of Care Note  Patient: Cora Mahan  Procedure(s) Performed: TOTAL HIP ARTHROPLASTY ANTERIOR APPROACH (Right: Hip)  Patient Location: PACU  Anesthesia Type:General  Level of Consciousness: awake, alert  and oriented  Airway & Oxygen Therapy: Patient Spontanous Breathing and Patient connected to face mask oxygen  Post-op Assessment: Post -op Vital signs reviewed and stable  Post vital signs: Reviewed and stable  Last Vitals:  Vitals Value Taken Time  BP 119/72 08/20/20 0902  Temp    Pulse 103 08/20/20 0904  Resp 11 08/20/20 0904  SpO2 95 % 08/20/20 0904  Vitals shown include unvalidated device data.  Last Pain:  Vitals:   08/20/20 0624  TempSrc: Tympanic  PainSc: 6          Complications: No notable events documented.

## 2020-08-20 NOTE — H&P (Signed)
Chief Complaint  Patient presents with   Pre-op Exam  Scheduled for Right THA    History of the Present Illness: Kimberly Nixon is a 55 y.o. female here today for history and physical for right total hip arthroplasty with Dr. Kennedy Bucker on 08/20/2020. Patient has had years of progressive right hip pain with x-ray showing severe degenerative arthritis of the right hip joint. She is underwent a successful left total hip arthroplasty. Pain in the right hip is 10 out of 10. She is tried over-the-counter medications with no relief. Pain is located in her groin, buttocks and thigh all the way down to the knee. She has a hard time performing activities daily living and activities involving hip flexion such as going up steps, getting in and out of a low vehicle and putting on socks and shoes. Patient has to sit to get relief. She can only walk short distances due to the pain.  The patient is employed as a Runner, broadcasting/film/video at [employer].   I have reviewed past medical, surgical, social and family history, and allergies as documented in the EMR.  Past Medical History: Past Medical History:  Diagnosis Date   Depression   MIGRAINE   Past Surgical History: Past Surgical History:  Procedure Laterality Date   CESAREAN SECTION   CHOLECYSTECTOMY   COLONOSCOPY 09/04/2014  [facility] (Father): [facility] 08/2019   COLONOSCOPY N/A 01/28/2020  Procedure: TN *R* COLONOSCOPY, FLEXIBLE; DIAGNOSTIC, INCLUDING COLLECTION OF SPECIMEN(S) BY BRUSHING OR WASHING, WHEN PERFORMED (SEPARATE PROCEDURE); Surgeon: Elnita Maxwell, MD; Location: [facility]; Service: Gastroenterology; Laterality: N/A; Reviewed 01/14/20 js   HYSTERECTOMY   Total hip arthroplasty anterior approach Left 12/18/2014  Dr.Avrie Kedzierski   Past Family History: Family History  Problem Relation Age of Onset   Colon cancer Father   Colon polyps Father   Colon cancer Paternal Aunt   Colon polyps Paternal Aunt   Medications: Current  Outpatient Medications Ordered in Epic  Medication Sig Dispense Refill   ALPRAZolam (XANAX) 0.25 MG tablet Take 0.25 mg by mouth once daily as needed   gabapentin (NEURONTIN) 300 MG capsule Take 1 capsule (300 mg total) by mouth nightly 30 capsule 11   HYDROcodone-acetaminophen (NORCO) 5-325 mg tablet Take 1 tablet by mouth every 8 (eight) hours as needed 20 tablet 0   ibuprofen (MOTRIN) 800 MG tablet Take 1 tablet (800 mg total) by mouth 4 (four) times daily after meals and nightly for 150 days 120 tablet 4   methocarbamoL (ROBAXIN) 500 MG tablet Take 1 tablet (500 mg total) by mouth 4 (four) times daily as needed (pain/spasm) 60 tablet 2   predniSONE (DELTASONE) 10 MG tablet Take 1 tablet (10 mg total) by mouth once daily 10 day taper, 5,5,4,4,3,3,2,2,1,1. Start with 5 tabs daily for 2 days then taper down 1 tab every 2 days. 30 tablet 0   No current Epic-ordered facility-administered medications on file.   Allergies: No Known Allergies   Body mass index is 42.16 kg/m.  Review of Systems: A comprehensive 14 point ROS was performed, reviewed, and the pertinent orthopaedic findings are documented in the HPI.  Vitals:  08/06/20 0839  BP: 122/80    General Physical Examination:  General:  Well developed, well nourished, no apparent distress, normal affect, antalgic gait with no assistive device.  HEENT: Head normocephalic, atraumatic, PERRL.   Abdomen: Soft, non tender, non distended, Bowel sounds present.  Heart: Examination of the heart reveals regular, rate, and rhythm. There is no murmur noted  on ascultation. There is a normal apical pulse.  Lungs: Lungs are clear to auscultation. There is no wheeze, rhonchi, or crackles. There is normal expansion of bilateral chest walls.   Musculoskeletal Examination: Right hip has 0 degrees external and 20 degrees internal. Moderate to severe pain with right hip internal rotation.  Radiographs: AP pelvis and lateral x-rays of the  right hip were reviewed today. These show advanced osteoarthritis with some subluxation of the right hip. Prior left total hip in good position with pedestal formation. On the lateral view of the right hip, there are large osteophytes around the anterior and posterior femoral head. Severe central hip arthritis with osteophytes noted on the superior acetabulum, as well as inferior acetabulum. Subchondral cyst formation, and some bony loss to the femoral head superiorly.  X-ray Impression Advanced osteoarthritis of the right hip.  Assessment: ICD-10-CM  1. Primary osteoarthritis of right hip M16.11   Plan: 25. 55 year old female with severe right hip pain. X-rays show advanced right hip osteoarthritis. Pain has been increasing over the last few years with no relief with conservative treatment. Pain is interfering with quality of life and activities daily living. Risks, benefits, complications of a right total hip arthroplasty and been discussed with the patient. Patient has agreed and consented procedure with Dr. Kennedy Bucker on 08/20/2020   Electronically signed by Patience Musca, PA at 08/06/2020 1:08 PM EDT  Reviewed  H+P. No changes noted.

## 2020-08-21 DIAGNOSIS — M1611 Unilateral primary osteoarthritis, right hip: Secondary | ICD-10-CM | POA: Diagnosis not present

## 2020-08-21 LAB — CBC
HCT: 34.8 % — ABNORMAL LOW (ref 36.0–46.0)
Hemoglobin: 11.2 g/dL — ABNORMAL LOW (ref 12.0–15.0)
MCH: 26.8 pg (ref 26.0–34.0)
MCHC: 32.2 g/dL (ref 30.0–36.0)
MCV: 83.3 fL (ref 80.0–100.0)
Platelets: 230 10*3/uL (ref 150–400)
RBC: 4.18 MIL/uL (ref 3.87–5.11)
RDW: 14 % (ref 11.5–15.5)
WBC: 14.7 10*3/uL — ABNORMAL HIGH (ref 4.0–10.5)
nRBC: 0 % (ref 0.0–0.2)

## 2020-08-21 LAB — BASIC METABOLIC PANEL
Anion gap: 6 (ref 5–15)
BUN: 8 mg/dL (ref 6–20)
CO2: 25 mmol/L (ref 22–32)
Calcium: 8.2 mg/dL — ABNORMAL LOW (ref 8.9–10.3)
Chloride: 106 mmol/L (ref 98–111)
Creatinine, Ser: 0.72 mg/dL (ref 0.44–1.00)
GFR, Estimated: >60 mL/min (ref >60)
Glucose, Bld: 192 mg/dL — ABNORMAL HIGH (ref 70–99)
Potassium: 4.3 mmol/L (ref 3.5–5.1)
Sodium: 137 mmol/L (ref 135–145)

## 2020-08-21 LAB — SURGICAL PATHOLOGY

## 2020-08-21 MED ORDER — TRAMADOL HCL 50 MG PO TABS: 50.0000 mg | ORAL_TABLET | Freq: Four times a day (QID) | ORAL | 0 refills | Status: DC | PRN

## 2020-08-21 MED ORDER — ACETAMINOPHEN 325 MG PO TABS: 325.0000 mg | ORAL_TABLET | Freq: Four times a day (QID) | ORAL | Status: DC | PRN

## 2020-08-21 MED ORDER — METHOCARBAMOL 500 MG PO TABS: 500.0000 mg | ORAL_TABLET | Freq: Four times a day (QID) | ORAL | 0 refills | Status: DC | PRN

## 2020-08-21 MED ORDER — ENOXAPARIN SODIUM 40 MG/0.4ML IJ SOSY: 40.0000 mg | PREFILLED_SYRINGE | INTRAMUSCULAR | 0 refills | Status: DC

## 2020-08-21 MED ORDER — OXYCODONE HCL 5 MG PO TABS: 5.0000 mg | ORAL_TABLET | ORAL | 0 refills | Status: DC | PRN

## 2020-08-21 MED ORDER — DOCUSATE SODIUM 100 MG PO CAPS: 100.0000 mg | ORAL_CAPSULE | Freq: Two times a day (BID) | ORAL | 0 refills | Status: DC

## 2020-08-21 NOTE — Progress Notes (Signed)
   Subjective: 1 Day Post-Op Procedure(s) (LRB): TOTAL HIP ARTHROPLASTY ANTERIOR APPROACH (Right) Patient reports pain as moderate.   Patient is well, and has had no acute complaints or problems Denies any CP, SOB, ABD pain. We will continue therapy today.  Plan is to go Home after hospital stay.  Objective: Vital signs in last 24 hours: Temp:  [97.6 F (36.4 C)-98.1 F (36.7 C)] 97.7 F (36.5 C) (06/10 0729) Pulse Rate:  [63-104] 68 (06/10 0729) Resp:  [10-23] 16 (06/10 0729) BP: (102-136)/(52-92) 102/55 (06/10 0729) SpO2:  [84 %-100 %] 98 % (06/10 0729)  Intake/Output from previous day: 06/09 0701 - 06/10 0700 In: 2725.3 [I.V.:2425.3; IV Piggyback:300] Out: 1510 [Urine:1510] Intake/Output this shift: No intake/output data recorded.  Recent Labs    08/20/20 1111 08/21/20 0356  HGB 12.9 11.2*   Recent Labs    08/20/20 1111 08/21/20 0356  WBC 13.3* 14.7*  RBC 4.85 4.18  HCT 40.4 34.8*  PLT 263 230   Recent Labs    08/20/20 1111 08/21/20 0356  NA  --  137  K  --  4.3  CL  --  106  CO2  --  25  BUN  --  8  CREATININE 0.93 0.72  GLUCOSE  --  192*  CALCIUM  --  8.2*   No results for input(s): LABPT, INR in the last 72 hours.  EXAM General - Patient is Alert, Appropriate, and Oriented Extremity - Neurovascular intact Sensation intact distally Intact pulses distally Dorsiflexion/Plantar flexion intact No cellulitis present Compartment soft Dressing - dressing C/D/I and no drainage, provena intact with out drainage Motor Function - intact, moving foot and toes well on exam.   Past Medical History:  Diagnosis Date   Anemia    Anxiety    Cervical high risk human papillomavirus (HPV) DNA test positive    Depression    Family history of colon cancer in father    Headache    PONV (postoperative nausea and vomiting)     Assessment/Plan:   1 Day Post-Op Procedure(s) (LRB): TOTAL HIP ARTHROPLASTY ANTERIOR APPROACH (Right) Active Problems:   S/P hip  replacement  Estimated body mass index is 41.97 kg/m as calculated from the following:   Height as of this encounter: 5\' 8"  (1.727 m).   Weight as of this encounter: 125.2 kg. Advance diet Up with therapy Work on BM Pain well controlled Labs and vital signs stable CM to assist with discharge to home with HHPT  DVT Prophylaxis - Lovenox, TED hose, and SCDs Weight-Bearing as tolerated to right leg   T. , PA-C [facility] 08/21/2020, 8:06 AM

## 2020-08-21 NOTE — Progress Notes (Addendum)
Physical Therapy Treatment Patient Details Name: Kimberly Nixon MRN: [id number] DOB: 06/07/1965 Today's Date: 08/21/2020    History of Present Illness Pt is a 55 yo female s/p R THA, WBAT. PMH of COPD, HTN, L THA.    PT Comments    Pt alert and motivated for session. BP checked in seated (100/61), increased in standing 107/75 due to low BP noted in chart review. Pt denied symptoms of lightheadedness with all activity, but increased fatigue was noted with ambulation and stair training. Further stair training was deferred to the afternoon due to fatigue. Pain levels at rest 4/10, increased with activity to 6/10.  Pt ambulated 160 feet with pain levels no greater than 6/10. CGA - supervision for increased levels of fatigue. Progressed to stair training today, and pt ascended and descended 4 steps with CGA. Bilateral hand rails used due to increased fear. PT to reassess with one handrail in PM to better mimic current home set-up. PT is necessary for continued mobility, increased independence with ADLs, and further stair training to maintain pt safety.     Follow Up Recommendations  Home health PT;Supervision - Intermittent     Equipment Recommendations  None recommended by PT    Recommendations for Other Services       Precautions / Restrictions Precautions Precautions: Fall;Anterior Hip Restrictions Weight Bearing Restrictions: Yes RLE Weight Bearing: Weight bearing as tolerated    Mobility  Bed Mobility               General bed mobility comments: Seated in chair at arrival, pt noted installing at home temporary bed rail for assistance    Transfers Overall transfer level: Needs assistance Equipment used: Rolling walker (2 wheeled) Transfers: Sit to/from Stand Sit to Stand: Min guard;Supervision            Ambulation/Gait Ambulation/Gait assistance: Land (Feet): 160 Feet Assistive device: Rolling walker (2 wheeled) Gait Pattern/deviations:  Step-through pattern Gait velocity: increased from previous tx session   General Gait Details: Seated rest break 30 ft, weight bearing in LE   Stairs Stairs: Yes Stairs assistance: Min guard     General stair comments: ascended, descended 4 stairs w/ bilateral rails   Wheelchair Mobility    Modified Rankin (Stroke Patients Only)       Balance Overall balance assessment: Needs assistance Sitting-balance support: Feet supported Sitting balance-Leahy Scale: Good     Standing balance support: During functional activity;Single extremity supported Standing balance-Leahy Scale: Fair                              Cognition Arousal/Alertness: Awake/alert Behavior During Therapy: WFL for tasks assessed/performed Overall Cognitive Status: Within Functional Limits for tasks assessed                                        Exercises      General Comments        Pertinent Vitals/Pain Pain Assessment: 0-10 Pain Score: 4  Pain Location: R hip Pain Descriptors / Indicators: Aching Pain Intervention(s): Limited activity within patient's tolerance;Patient requesting pain meds-RN notified;Monitored during session;Repositioned (Nursing in route with pain meds post session)    Home Living Family/patient expects to be discharged to:: Private residence                    Prior  Function            PT Goals (current goals can now be found in the care plan section) Acute Rehab PT Goals Patient Stated Goal: to go home PT Goal Formulation: With patient Time For Goal Achievement: 09/03/20 Potential to Achieve Goals: Good Progress towards PT goals: Progressing toward goals    Frequency    BID      PT Plan Current plan remains appropriate    Co-evaluation              AM-PAC PT "6 Clicks" Mobility   Outcome Measure  Help needed turning from your back to your side while in a flat bed without using bedrails?: None Help needed  moving from lying on your back to sitting on the side of a flat bed without using bedrails?: None Help needed moving to and from a bed to a chair (including a wheelchair)?: None Help needed standing up from a chair using your arms (e.g., wheelchair or bedside chair)?: None Help needed to walk in hospital room?: A Little Help needed climbing 3-5 steps with a railing? : A Little 6 Click Score: 22    End of Session Equipment Utilized During Treatment: Gait belt Activity Tolerance: Patient tolerated treatment well Patient left: in chair;with chair alarm set;with call bell/phone within reach;with SCD's reapplied Nurse Communication: Mobility status (Updated mobility plans for PM) PT Visit Diagnosis: Other abnormalities of gait and mobility (R26.89);Muscle weakness (generalized) (M62.81);Difficulty in walking, not elsewhere classified (R26.2);Pain Pain - Right/Left: Right Pain - part of body: Hip     Time: 9379-0240 PT Time Calculation (min) (ACUTE ONLY): 39 min  Charges:  $Gait Training: 23-37 mins $Therapeutic Activity: 8-22 mins                     [employer], SPT   [employer] 08/21/2020, 10:31 AM

## 2020-08-21 NOTE — Discharge Instructions (Signed)

## 2020-08-21 NOTE — Discharge Summary (Signed)
Physician Discharge Summary  Patient ID: Kimberly Nixon MRN: [id number] DOB/AGE: 1965-09-15 55 y.o.  Admit date: 08/20/2020 Discharge date: 08/21/2020  Admission Diagnoses:  S/P hip replacement [Z96.649]   Discharge Diagnoses: Patient Active Problem List   Diagnosis Date Noted   S/P hip replacement 08/20/2020   Need for COVID-19 vaccine 12/24/2019   Renal colic on right side 11/26/2019   Right-sided low back pain without sciatica 11/26/2019   Smoker 09/11/2019   Essential hypertension 09/04/2019   Chronic obstructive pulmonary disease (HCC) 09/04/2019   Morbid obesity (HCC) 09/04/2019   Tendinitis involving right hip abductors 09/04/2019   Anxiety 08/21/2019   History of hysterectomy 09/18/2018   Cervical high risk human papillomavirus (HPV) DNA test positive 08/20/2018   Primary osteoarthritis of left hip 12/18/2014    Past Medical History:  Diagnosis Date   Anemia    Anxiety    Cervical high risk human papillomavirus (HPV) DNA test positive    Depression    Family history of colon cancer in father    Headache    PONV (postoperative nausea and vomiting)      Transfusion: none   Consultants (if any):   Discharged Condition: Improved  Hospital Course: Kimberly Nixon is an 55 y.o. female who was admitted 08/20/2020 with a diagnosis of right hip osteoarthritis and went to the operating room on 08/20/2020 and underwent the above named procedures.    Surgeries: Procedure(s): TOTAL HIP ARTHROPLASTY ANTERIOR APPROACH on 08/20/2020 Patient tolerated the surgery well. Taken to PACU where she was stabilized and then transferred to the orthopedic floor.  Started on Lovenox 40 mg q 24 hrs. Foot pumps applied bilaterally at 80 mm. Heels elevated on bed with rolled towels. No evidence of DVT. Negative Homan. Physical therapy started on day #1 for gait training and transfer. OT started day #1 for ADL and assisted devices.  Patient's foley was d/c on day #1. Patient's IV was d/c on  day #1.  On post op day #1 patient was stable and ready for discharge to home with HHPT.  Implants: Medacta Masterloc 6 lateralized with 58 mm Mpact DM cup and liner with ceramic L 28 mm head  She was given perioperative antibiotics:  Anti-infectives (From admission, onward)    Start     Dose/Rate Route Frequency Ordered Stop   08/20/20 1300  ceFAZolin (ANCEF) IVPB 2g/100 mL premix        2 g 200 mL/hr over 30 Minutes Intravenous Every 6 hours 08/20/20 1058 08/20/20 2042   08/20/20 0600  ceFAZolin (ANCEF) IVPB 3g/100 mL premix        3 g 200 mL/hr over 30 Minutes Intravenous On call to O.R. 08/20/20 9381 08/20/20 0741     .  She was given sequential compression devices, early ambulation, and Lovenox TEDs for DVT prophylaxis.  She benefited maximally from the hospital stay and there were no complications.    Recent vital signs:  Vitals:   08/21/20 0500 08/21/20 0729  BP: (!) 111/52 (!) 102/55  Pulse: 63 68  Resp:  16  Temp: 97.7 F (36.5 C) 97.7 F (36.5 C)  SpO2: 97% 98%    Recent laboratory studies:  Lab Results  Component Value Date   HGB 11.2 (L) 08/21/2020   HGB 12.9 08/20/2020   HGB 14.1 08/06/2020   Lab Results  Component Value Date   WBC 14.7 (H) 08/21/2020   PLT 230 08/21/2020   Lab Results  Component Value Date   INR 1.00 12/11/2014  Lab Results  Component Value Date   NA 137 08/21/2020   K 4.3 08/21/2020   CL 106 08/21/2020   CO2 25 08/21/2020   BUN 8 08/21/2020   CREATININE 0.72 08/21/2020   GLUCOSE 192 (H) 08/21/2020    Discharge Medications:   Allergies as of 08/21/2020   No Known Allergies      Medication List     STOP taking these medications    HYDROcodone-acetaminophen 5-325 MG tablet Commonly known as: NORCO/VICODIN   ibuprofen 800 MG tablet Commonly known as: ADVIL       TAKE these medications    acetaminophen 325 MG tablet Commonly known as: TYLENOL Take 1-2 tablets (325-650 mg total) by mouth every 6 (six)  hours as needed for mild pain (pain score 1-3 or temp > 100.5).   ALPRAZolam 0.25 MG tablet Commonly known as: XANAX Take 1 tablet (0.25 mg total) by mouth 2 (two) times daily as needed for anxiety.   docusate sodium 100 MG capsule Commonly known as: COLACE Take 1 capsule (100 mg total) by mouth 2 (two) times daily.   enoxaparin 40 MG/0.4ML injection Commonly known as: LOVENOX Inject 0.4 mLs (40 mg total) into the skin daily. Start taking on: August 22, 2020   gabapentin 300 MG capsule Commonly known as: NEURONTIN Take 300 mg by mouth at bedtime as needed (pain).   methocarbamol 500 MG tablet Commonly known as: ROBAXIN Take 1 tablet (500 mg total) by mouth every 6 (six) hours as needed for muscle spasms. What changed: when to take this   oxyCODONE 5 MG immediate release tablet Commonly known as: Oxy IR/ROXICODONE Take 1-2 tablets (5-10 mg total) by mouth every 4 (four) hours as needed for moderate pain (pain score 4-6).   traMADol 50 MG tablet Commonly known as: ULTRAM Take 1 tablet (50 mg total) by mouth every 6 (six) hours as needed.               Durable Medical Equipment  (From admission, onward)           Start     Ordered   08/20/20 1059  DME Walker rolling  Once       Question Answer Comment  Walker: With 5 Inch Wheels   Patient needs a walker to treat with the following condition S/P hip replacement      08/20/20 1058   08/20/20 1059  DME Bedside commode  Once       Question:  Patient needs a bedside commode to treat with the following condition  Answer:  S/P hip replacement   08/20/20 1058   08/20/20 1059  DME 3 n 1  Once        08/20/20 1058            Diagnostic Studies: DG HIP OPERATIVE UNILAT W OR W/O PELVIS RIGHT  Result Date: 08/20/2020 CLINICAL DATA:  Status post right total hip arthroplasty. EXAM: OPERATIVE right HIP (WITH PELVIS IF PERFORMED) 7 VIEWS TECHNIQUE: Fluoroscopic spot image(s) were submitted for interpretation  post-operatively. Radiation exposure index: 4.5 mGy. COMPARISON:  None. FINDINGS: Seven intraoperative fluoroscopic images were obtained during right hip arthroplasty. The right femoral and acetabular components appear to be well situated. IMPRESSION: Fluoroscopic guidance provided during right total hip arthroplasty. Electronically Signed   By: Lupita Raider M.D.   On: 08/20/2020 10:52   DG HIP UNILAT W OR W/O PELVIS 2-3 VIEWS RIGHT  Result Date: 08/20/2020 CLINICAL DATA:  Status post right total hip  arthroplasty. EXAM: DG HIP (WITH OR WITHOUT PELVIS) 2-3V RIGHT COMPARISON:  Fluoroscopic images of same day. FINDINGS: The right femoral and acetabular components appear to be well situated. Expected postoperative changes are seen in the surrounding soft tissues. IMPRESSION: Status post right total hip arthroplasty. Electronically Signed   By: Lupita Raider M.D.   On: 08/20/2020 10:03    Disposition:      Follow-up Information     Evon Slack, PA-C Follow up in 2 week(s).   Specialties: Orthopedic Surgery, Emergency Medicine Contact information: [address] [place] [address] [phone]                  Signed: Patience Musca 08/21/2020, 1:36 PM

## 2020-08-21 NOTE — Evaluation (Signed)
Occupational Therapy Evaluation Patient Details Name: Kimberly Nixon MRN: [id number] DOB: 11-07-1965 Today's Date: 08/21/2020    History of Present Illness Pt is a 55 yo female s/p R THA, WBAT. PMH of COPD, HTN, L THA.   Clinical Impression   Pt seen for OT evaluation this date, POD#1 from above surgery. Pt was independent in all ADLs prior to surgery, working as a Runner, broadcasting/film/video. Pt is eager to return to PLOF with less pain and improved safety and independence. Pt currently requires PRN MIN A for LB dressing and bathing while in seated position due to pain and limited AROM of R hip. Pt reports family able to provide needed level of assist. Pt instructed in self care skills, falls prevention strategies, home/routines modifications, DME/AE for LB bathing, dressing tasks, and bed rails for bed mobility, and compression stocking mgt strategies. Handout provided to support recall and carryover. Pt verbalized understanding. Do not anticipate additional skilled OT needs at this time. Will sign off.     Follow Up Recommendations  No OT follow up    Equipment Recommendations  None recommended by OT    Recommendations for Other Services       Precautions / Restrictions Precautions Precautions: Fall;Anterior Hip Precaution Booklet Issued: Yes (comment) Restrictions Weight Bearing Restrictions: Yes RLE Weight Bearing: Weight bearing as tolerated      Mobility Bed Mobility Overal bed mobility: Needs Assistance Bed Mobility: Sit to Supine       Sit to supine: Min assist   General bed mobility comments: Min A for RLE mgt    Transfers Overall transfer level: Needs assistance Equipment used: Rolling walker (2 wheeled) Transfers: Sit to/from Stand Sit to Stand: Min guard;Supervision              Balance Overall balance assessment: Needs assistance Sitting-balance support: Feet supported Sitting balance-Leahy Scale: Good     Standing balance support: Bilateral upper extremity  supported Standing balance-Leahy Scale: Good                             ADL either performed or assessed with clinical judgement   ADL                                         General ADL Comments: PRN MIN A for LB ADL, pt reports family able to assist     Vision Patient Visual Report: No change from baseline       Perception     Praxis      Pertinent Vitals/Pain Pain Assessment: 0-10 Pain Score: 4  Pain Location: R hip Pain Descriptors / Indicators: Aching Pain Intervention(s): Limited activity within patient's tolerance;Monitored during session;Premedicated before session;Repositioned     Hand Dominance Right   Extremity/Trunk Assessment Upper Extremity Assessment Upper Extremity Assessment: Generalized weakness   Lower Extremity Assessment Lower Extremity Assessment: RLE deficits/detail RLE Deficits / Details: s/p R THA       Communication Communication Communication: No difficulties   Cognition Arousal/Alertness: Awake/alert Behavior During Therapy: WFL for tasks assessed/performed Overall Cognitive Status: Within Functional Limits for tasks assessed                                     General Comments       Exercises Other  Exercises Other Exercises: Pt instructed in bed mobility, bed rails, AE/DME, falls prevention, and home/routines modifications to support recovery; handout provided   Shoulder Instructions      Home Living Family/patient expects to be discharged to:: Private residence Living Arrangements: Other relatives (nephew, son) Available Help at Discharge: Family;Available 24 hours/day Type of Home: House Home Access: Stairs to enter [employer] of Steps: one threshold step   Home Layout: Two level Alternate Level Stairs-Number of Steps: 12 Alternate Level Stairs-Rails: Right Bathroom Shower/Tub: Chief Strategy Officer: Standard     Home Equipment: Environmental consultant - 2  wheels;Bedside commode;Hand held shower head          Prior Functioning/Environment Level of Independence: Independent        Comments: works full time as a Runner, broadcasting/film/video. 2 falls in the last 6 months, one in Feb that may have led to a concussion, and caused back and RLE pain        OT Problem List: Decreased strength;Pain      OT Treatment/Interventions:      OT Goals(Current goals can be found in the care plan section) Acute Rehab OT Goals Patient Stated Goal: to go home OT Goal Formulation: All assessment and education complete, DC therapy  OT Frequency:     Barriers to D/C:            Co-evaluation              AM-PAC OT "6 Clicks" Daily Activity     Outcome Measure Help from another person eating meals?: None Help from another person taking care of personal grooming?: None Help from another person toileting, which includes using toliet, bedpan, or urinal?: None Help from another person bathing (including washing, rinsing, drying)?: A Little Help from another person to put on and taking off regular upper body clothing?: None Help from another person to put on and taking off regular lower body clothing?: A Little 6 Click Score: 22   End of Session    Activity Tolerance: Patient tolerated treatment well Patient left: in bed;with call bell/phone within reach;with bed alarm set;with SCD's reapplied  OT Visit Diagnosis: Other abnormalities of gait and mobility (R26.89);Pain Pain - Right/Left: Right Pain - part of body: Hip                Time: 3474-2595 OT Time Calculation (min): 20 min Charges:  OT General Charges $OT Visit: 1 Visit OT Evaluation $OT Eval Low Complexity: 1 Low OT Treatments $Self Care/Home Management : 8-22 mins  Wynona Canes, MPH, MS, OTR/L ascom [phone] 08/21/20, 11:15 AM

## 2020-08-21 NOTE — Progress Notes (Addendum)
Physical Therapy Treatment Patient Details Name: Kimberly Nixon MRN: [id number] DOB: Mar 03, 1966 Today's Date: 08/21/2020    History of Present Illness Pt is a 55 yo female s/p R THA, WBAT. PMH of COPD, HTN, L THA.    PT Comments     Pt alert and motivated for afternoon session, resting pain levels 3-4/10 and maintained throughout activity. Fatigue was diminished beginning treatment compared to AM.  Pt ambulated with supervision a total of 160 feet without a rest break. Progressed to ascending and descending 4 steps x 2 with BUE on the right hand rail. Pt requested a small rest break after performing stairs. The patient did not require more than one set of verbal cues for correct RW usage during sit <>stand. Reminders were necessary for correct stair technique, pt was able to teach back stair technique at session termination. Pt will benefit from further PT services to continue gaining independence with ADLs and increasing mobility. Current discharge recommendations remain.    Follow Up Recommendations  Home health PT;Supervision - Intermittent     Equipment Recommendations  None recommended by PT    Recommendations for Other Services       Precautions / Restrictions Precautions Precautions: Fall;Anterior Hip Precaution Booklet Issued: Yes (comment) Restrictions Weight Bearing Restrictions: Yes RLE Weight Bearing: Weight bearing as tolerated    Mobility  Bed Mobility Overal bed mobility: Needs Assistance Bed Mobility: Sit to Supine       Sit to supine: Supervision   General bed mobility comments: Utilizes bed rails    Transfers Overall transfer level: Needs assistance Equipment used: Rolling walker (2 wheeled) Transfers: Sit to/from Stand Sit to Stand: Supervision            Ambulation/Gait Ambulation/Gait assistance: Min guard Gait Distance (Feet): 160 Feet Assistive device: Rolling walker (2 wheeled) Gait Pattern/deviations: Step-through pattern          Stairs Stairs: Yes Stairs assistance: Min guard Stair Management: One rail Right Number of Stairs: 16 General stair comments: ascended, descended 4 stairs x 2 w/ BUE on R rail   Wheelchair Mobility    Modified Rankin (Stroke Patients Only)       Balance Overall balance assessment: Needs assistance Sitting-balance support: No upper extremity supported;Feet supported Sitting balance-Leahy Scale: Good     Standing balance support: Single extremity supported Standing balance-Leahy Scale: Good Standing balance comment: Static stance w/ dynamic reaching and 1 hand on RW                            Cognition Arousal/Alertness: Awake/alert Behavior During Therapy: WFL for tasks assessed/performed Overall Cognitive Status: Within Functional Limits for tasks assessed                                        Exercises      General Comments        Pertinent Vitals/Pain Pain Assessment: 0-10 Pain Score: 3  Pain Location: R hip Pain Descriptors / Indicators: Throbbing Pain Intervention(s): Limited activity within patient's tolerance;Monitored during session;Premedicated before session;Repositioned    Home Living                      Prior Function            PT Goals (current goals can now be found in the care plan section) Acute Rehab  PT Goals Patient Stated Goal: to go home PT Goal Formulation: With patient Time For Goal Achievement: 09/03/20 Progress towards PT goals: Progressing toward goals    Frequency    BID      PT Plan Current plan remains appropriate    Co-evaluation              AM-PAC PT "6 Clicks" Mobility   Outcome Measure  Help needed turning from your back to your side while in a flat bed without using bedrails?: None Help needed moving from lying on your back to sitting on the side of a flat bed without using bedrails?: None Help needed moving to and from a bed to a chair (including a  wheelchair)?: None Help needed standing up from a chair using your arms (e.g., wheelchair or bedside chair)?: None Help needed to walk in hospital room?: A Little Help needed climbing 3-5 steps with a railing? : A Little 6 Click Score: 22    End of Session Equipment Utilized During Treatment: Gait belt Activity Tolerance: Patient tolerated treatment well Patient left: in bed;with call bell/phone within reach;with bed alarm set   PT Visit Diagnosis: Other abnormalities of gait and mobility (R26.89);Muscle weakness (generalized) (M62.81);Difficulty in walking, not elsewhere classified (R26.2);Pain Pain - Right/Left: Right Pain - part of body: Hip     Time: 4540-9811 PT Time Calculation (min) (ACUTE ONLY): 25 min  Charges:  $Gait Training: 8-22 mins $Therapeutic Activity: 8-22 mins                     [employer], SPT   Olga Coaster 08/21/2020, 3:18 PM

## 2020-08-21 NOTE — TOC Transition Note (Signed)
Transition of Care [facility]) - CM/SW Discharge Note   Patient Details  Name: Kimberly Nixon MRN: [id number] Date of Birth: 1965-09-27  [facility] CM/SW Contact:  Margarito Liner, LCSW Phone Number: 08/21/2020, 1:44 PM   Clinical Narrative:   Patient has orders to discharge home today. [facility] representative is aware. No further concerns. CSW signing off.  Final next level of care: Home w Home Health Services Barriers to Discharge: Barriers Resolved   Patient Goals and CMS Choice     Choice offered to / list presented to : Patient  Discharge Placement                    Patient and family notified of of transfer: 08/21/20  Discharge Plan and Services     Post Acute Care Choice: Home Health                    HH Arranged: PT, Nurse's Aide [facility] Agency: [facility] Date [facility] Agency Contacted: 08/21/20   Representative spoke with at [facility] Agency: [place] [facility] - Left message  Social Determinants of Health (SDOH) Interventions     Readmission Risk Interventions No flowsheet data found.

## 2020-08-21 NOTE — Progress Notes (Signed)
Blood pressure (!) 114/53, pulse 74, temperature 98 F (36.7 C), temperature source Oral, resp. rate 15, height 5\' 8"  (1.727 m), weight 125.2 kg, SpO2 99 %.  IV cath. Removed site was clean,dry and intact. Went over discharge packet with pt which she verbalized understanding of the information provided. Transported via W/C with all belongings and scripts to private car.

## 2020-08-21 NOTE — TOC Initial Note (Signed)
Transition of Care [facility]) - Initial/Assessment Note    Patient Details  Name: Kimberly Nixon MRN: [id number] Date of Birth: 03-Apr-1965  [facility]) CM/SW Contact:    Candie Chroman, LCSW Phone Number: 08/21/2020, 1:17 PM  Clinical Narrative:   CSW met with patient. No supports at bedside. CSW introduced role and explained that PT recommendations would be discussed. Patient was set up with [facility] prior to admission and is agreeable to this. No DME recommendations. No further concerns. CSW encouraged patient to contact CSW as needed. CSW will continue to follow patient for support and facilitate return home when discharged, likely today.                Expected Discharge Plan: [place] Barriers to Discharge: Continued Medical Work up   Patient Goals and CMS Choice     Choice offered to / list presented to : Patient  Expected Discharge Plan and Services Expected Discharge Plan: [place] Acute Care Choice: [place] arrangements for the past 2 months: Single Family Home                           HH Arranged: PT, Nurse's Aide HH Agency: [place] Date [place]: 08/21/20   Representative spoke with at [place]: [place] [facility] - Left message  Prior Living Arrangements/Services Living arrangements for the past 2 months: Single Family Home Lives with:: Adult Children, Relatives Patient language and need for interpreter reviewed:: Yes Do you feel safe going back to the place where you live?: Yes      Need for Family Participation in Patient Care: Yes (Comment) Care giver support system in place?: Yes (comment)   Criminal Activity/Legal Involvement Pertinent to Current Situation/Hospitalization: No - Comment as needed  Activities of Daily Living Home Assistive Devices/Equipment: Eyeglasses ADL Screening (condition at time of admission) Patient's cognitive ability  adequate to safely complete daily activities?: Yes Is the patient deaf or have difficulty hearing?: No Does the patient have difficulty seeing, even when wearing glasses/contacts?: No Does the patient have difficulty concentrating, remembering, or making decisions?: No Patient able to express need for assistance with ADLs?: Yes Does the patient have difficulty dressing or bathing?: No Independently performs ADLs?: Yes (appropriate for developmental age) Does the patient have difficulty walking or climbing stairs?: No Weakness of Legs: None Weakness of Arms/Hands: None  Permission Sought/Granted Permission sought to share information with : Facility Art therapist granted to share information with : Yes, Verbal Permission Granted     Permission granted to share info w AGENCY: [place]        Emotional Assessment Appearance:: Appears stated age Attitude/Demeanor/Rapport: Engaged, Gracious Affect (typically observed): Accepting, Appropriate, Calm, Pleasant Orientation: : Oriented to Self, Oriented to Place, Oriented to  Time, Oriented to Situation Alcohol / Substance Use: Not Applicable Psych Involvement: No (comment)  Admission diagnosis:  S/P hip replacement [Z96.649] Patient Active Problem List   Diagnosis Date Noted   S/P hip replacement 08/20/2020   Need for COVID-19 vaccine 40/98/1191   Renal colic on right side 47/82/9562   Right-sided low back pain without sciatica 11/26/2019   Smoker 09/11/2019   Essential hypertension 09/04/2019   Chronic obstructive pulmonary disease ([place]) 09/04/2019   Morbid obesity ([place]) 09/04/2019   Tendinitis involving right hip abductors 09/04/2019   Anxiety 08/21/2019   History of  hysterectomy 09/18/2018   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

## 2020-08-22 ENCOUNTER — Encounter: Payer: Self-pay | Admitting: Orthopedic Surgery

## 2020-09-18 ENCOUNTER — Other Ambulatory Visit: Payer: Self-pay

## 2020-09-18 ENCOUNTER — Ambulatory Visit: Payer: BC Managed Care – PPO | Admitting: Obstetrics and Gynecology

## 2020-09-18 ENCOUNTER — Ambulatory Visit
Admission: RE | Admit: 2020-09-18 | Discharge: 2020-09-18 | Disposition: A | Discharge status: Home / Self Care | Payer: BC Managed Care – PPO | Source: Ambulatory Visit | Attending: Obstetrics and Gynecology | Admitting: Obstetrics and Gynecology

## 2020-09-18 DIAGNOSIS — Z1231 Encounter for screening mammogram for malignant neoplasm of breast: Secondary | ICD-10-CM | POA: Insufficient documentation

## 2020-10-28 ENCOUNTER — Ambulatory Visit: Payer: Self-pay | Admitting: Obstetrics and Gynecology

## 2020-11-30 ENCOUNTER — Other Ambulatory Visit: Payer: Self-pay | Admitting: *Deleted

## 2020-12-09 ENCOUNTER — Other Ambulatory Visit: Payer: Self-pay

## 2020-12-09 ENCOUNTER — Encounter: Payer: Self-pay | Admitting: Internal Medicine

## 2020-12-09 ENCOUNTER — Ambulatory Visit (INDEPENDENT_AMBULATORY_CARE_PROVIDER_SITE_OTHER): Payer: BC Managed Care – PPO | Admitting: Internal Medicine

## 2020-12-09 VITALS — BP 131/85 | HR 68 | Ht 68.0 in | Wt 271.2 lb

## 2020-12-09 DIAGNOSIS — M1612 Unilateral primary osteoarthritis, left hip: Secondary | ICD-10-CM | POA: Diagnosis not present

## 2020-12-09 DIAGNOSIS — I10 Essential (primary) hypertension: Secondary | ICD-10-CM | POA: Diagnosis not present

## 2020-12-09 DIAGNOSIS — F17200 Nicotine dependence, unspecified, uncomplicated: Secondary | ICD-10-CM | POA: Diagnosis not present

## 2020-12-09 DIAGNOSIS — R809 Proteinuria, unspecified: Secondary | ICD-10-CM | POA: Diagnosis not present

## 2020-12-09 DIAGNOSIS — N23 Unspecified renal colic: Secondary | ICD-10-CM

## 2020-12-09 DIAGNOSIS — H6061 Unspecified chronic otitis externa, right ear: Secondary | ICD-10-CM

## 2020-12-09 DIAGNOSIS — E569 Vitamin deficiency, unspecified: Secondary | ICD-10-CM

## 2020-12-09 MED ORDER — ALPRAZOLAM 0.25 MG PO TABS: 0.2500 mg | ORAL_TABLET | Freq: Two times a day (BID) | ORAL | 0 refills | Status: DC | PRN

## 2020-12-09 NOTE — Assessment & Plan Note (Signed)

## 2020-12-09 NOTE — Assessment & Plan Note (Signed)

## 2020-12-09 NOTE — Assessment & Plan Note (Signed)
Chronic problem she was advised to take vitamin D 1000 units p.o. daily

## 2020-12-09 NOTE — Assessment & Plan Note (Signed)
Stable at the present time. 

## 2020-12-09 NOTE — Assessment & Plan Note (Signed)
-   I instructed the patient to stop smoking and provided them with smoking cessation materials.  - I informed the patient that smoking puts them at increased risk for cancer, COPD, hypertension, and more.  - Informed the patient to seek help if they begin to have trouble breathing, develop chest pain, start to cough up blood, feel faint, or pass out.  

## 2020-12-09 NOTE — Progress Notes (Signed)
Established Patient Office Visit  Subjective:  Patient ID: Kimberly Nixon, female    DOB: [birth year]  Age: 55 y.o. MRN: [id number]  CC:  Chief Complaint  Patient presents with   Anxiety    Patient needs refill of xanax     Anxiety     [name] presents for check up  Past Medical History:  Diagnosis Date   Anemia    Anxiety    Cervical high risk human papillomavirus (HPV) DNA test positive    Depression    Family history of colon cancer in father    Headache    PONV (postoperative nausea and vomiting)     Past Surgical History:  Procedure Laterality Date   ABDOMINAL HYSTERECTOMY  2003   CESAREAN SECTION  1997   CHOLECYSTECTOMY  2011   COLONOSCOPY  2016, 2021   JOINT REPLACEMENT     TOTAL HIP ARTHROPLASTY Left 12/18/2014   Procedure: TOTAL HIP ARTHROPLASTY ANTERIOR APPROACH;  Surgeon: Kennedy Bucker, MD;  Location: [facility];  Service: Orthopedics;  Laterality: Left;   TOTAL HIP ARTHROPLASTY Right 08/20/2020   Procedure: TOTAL HIP ARTHROPLASTY ANTERIOR APPROACH;  Surgeon: Kennedy Bucker, MD;  Location: [facility];  Service: Orthopedics;  Laterality: Right;    Family History  Problem Relation Age of Onset   Colon cancer Father 27   Breast cancer Maternal Grandmother 7   Ovarian cancer Neg Hx     Social History   Socioeconomic History   Marital status: Divorced    Spouse name: Not on file   Number of children: 1   Years of education: Not on file   Highest education level: Not on file  Occupational History   Not on file  Tobacco Use   Smoking status: Every Day    Packs/day: 0.25    Types: Cigarettes   Smokeless tobacco: Never  Vaping Use   Vaping Use: Never used  Substance and Sexual Activity   Alcohol use: Not Currently    Comment: rarely   Drug use: No   Sexual activity: Yes    Birth control/protection: Surgical    Comment: Hysterectomy  Other Topics Concern   Not on file  Social History Narrative   Not on file   Social Determinants of  Health   Financial Resource Strain: Not on file  Food Insecurity: Not on file  Transportation Needs: Not on file  Physical Activity: Not on file  Stress: Not on file  Social Connections: Not on file  Intimate Partner Violence: Not on file     Current Outpatient Medications:    acetaminophen (TYLENOL) 325 MG tablet, Take 1-2 tablets (325-650 mg total) by mouth every 6 (six) hours as needed for mild pain (pain score 1-3 or temp > 100.5)., Disp: , Rfl:    ALPRAZolam (XANAX) 0.25 MG tablet, Take 1 tablet (0.25 mg total) by mouth 2 (two) times daily as needed for anxiety., Disp: 60 tablet, Rfl: 0   docusate sodium (COLACE) 100 MG capsule, Take 1 capsule (100 mg total) by mouth 2 (two) times daily., Disp: 10 capsule, Rfl: 0   enoxaparin (LOVENOX) 40 MG/0.4ML injection, Inject 0.4 mLs (40 mg total) into the skin daily., Disp: 5.6 mL, Rfl: 0   gabapentin (NEURONTIN) 300 MG capsule, Take 300 mg by mouth at bedtime as needed (pain)., Disp: , Rfl:    methocarbamol (ROBAXIN) 500 MG tablet, Take 1 tablet (500 mg total) by mouth every 6 (six) hours as needed for muscle spasms., Disp: 30 tablet, Rfl:  0   oxyCODONE (OXY IR/ROXICODONE) 5 MG immediate release tablet, Take 1-2 tablets (5-10 mg total) by mouth every 4 (four) hours as needed for moderate pain (pain score 4-6)., Disp: 30 tablet, Rfl: 0   traMADol (ULTRAM) 50 MG tablet, Take 1 tablet (50 mg total) by mouth every 6 (six) hours as needed., Disp: 30 tablet, Rfl: 0   No Known Allergies  ROS Review of Systems  Constitutional: Negative.   HENT: Negative.    Eyes: Negative.   Respiratory: Negative.    Cardiovascular: Negative.   Gastrointestinal: Negative.   Endocrine: Negative.   Genitourinary: Negative.   Musculoskeletal: Negative.   Skin: Negative.   Allergic/Immunologic: Negative.   Neurological: Negative.   Hematological: Negative.   Psychiatric/Behavioral: Negative.    All other systems reviewed and are negative.    Objective:     Physical Exam Vitals reviewed.  Constitutional:      Appearance: Normal appearance.  HENT:     Mouth/Throat:     Mouth: Mucous membranes are moist.  Eyes:     Pupils: Pupils are equal, round, and reactive to light.  Neck:     Vascular: No carotid bruit.  Cardiovascular:     Rate and Rhythm: Normal rate and regular rhythm.     Pulses: Normal pulses.     Heart sounds: Normal heart sounds.  Pulmonary:     Effort: Pulmonary effort is normal.     Breath sounds: Normal breath sounds.  Abdominal:     General: Bowel sounds are normal.     Palpations: Abdomen is soft. There is no hepatomegaly, splenomegaly or mass.     Tenderness: There is no abdominal tenderness.     Hernia: No hernia is present.  Musculoskeletal:        General: No tenderness.     Cervical back: Neck supple.     Right lower leg: No edema.     Left lower leg: No edema.  Skin:    Findings: No rash.  Neurological:     Mental Status: She is alert and oriented to person, place, and time.     Motor: No weakness.  Psychiatric:        Mood and Affect: Mood and affect normal.        Behavior: Behavior normal.    BP 131/85   Pulse 68   Ht 5\' 8"  (1.727 m)   Wt 271 lb 3.2 oz (123 kg)   BMI 41.24 kg/m  Wt Readings from Last 3 Encounters:  12/09/20 271 lb 3.2 oz (123 kg)  08/20/20 276 lb (125.2 kg)  08/06/20 276 lb (125.2 kg)     Health Maintenance Due  Topic Date Due   HIV Screening  Never done   Hepatitis C Screening  Never done   TETANUS/TDAP  Never done   COLONOSCOPY (Pts 45-20yrs Insurance coverage will need to be confirmed)  Never done   Zoster Vaccines- Shingrix (1 of 2) Never done   COVID-19 Vaccine (4 - Booster for Pfizer series) 04/26/2020   INFLUENZA VACCINE  Never done    There are no preventive care reminders to display for this patient.  Lab Results  Component Value Date   TSH 2.58 08/30/2019   Lab Results  Component Value Date   WBC 14.7 (H) 08/21/2020   HGB 11.2 (L) 08/21/2020    HCT 34.8 (L) 08/21/2020   MCV 83.3 08/21/2020   PLT 230 08/21/2020   Lab Results  Component Value Date   NA  137 08/21/2020   K 4.3 08/21/2020   CO2 25 08/21/2020   GLUCOSE 192 (H) 08/21/2020   BUN 8 08/21/2020   CREATININE 0.72 08/21/2020   BILITOT 0.6 08/06/2020   ALKPHOS 63 08/06/2020   AST 17 08/06/2020   ALT 20 08/06/2020   PROT 7.0 08/06/2020   ALBUMIN 3.7 08/06/2020   CALCIUM 8.2 (L) 08/21/2020   ANIONGAP 6 08/21/2020   Lab Results  Component Value Date   CHOL 254 (H) 08/30/2019   Lab Results  Component Value Date   HDL CANCELED 08/30/2019   Lab Results  Component Value Date   LDLCALC CANCELED 08/30/2019   Lab Results  Component Value Date   TRIG 339 (H) 08/30/2019   No results found for: CHOLHDL Lab Results  Component Value Date   HGBA1C 6.0 09/28/2012      Assessment & Plan:   Problem List Items Addressed This Visit       Cardiovascular and Mediastinum   Essential hypertension     Patient denies any chest pain or shortness of breath there is no history of palpitation or paroxysmal nocturnal dyspnea   patient was advised to follow low-salt low-cholesterol diet    ideally I want to keep systolic blood pressure below 381 mmHg, patient was asked to check blood pressure one times a week and give me a report on that.  Patient will be follow-up in 3 months  or earlier as needed, patient will call me back for any change in the cardiovascular symptoms Patient was advised to buy a book from local bookstore concerning blood pressure and read several chapters  every day.  This will be supplemented by some of the material we will give him from the office.  Patient should also utilize other resources like YouTube and Internet to learn more about the blood pressure and the diet.        Nervous and Auditory   Chronic otitis externa of right ear    Patient was started on ofloxacin 1 drop twice a day in the right ear for 5 days        Musculoskeletal and  Integument   Primary osteoarthritis of left hip    Chronic problem she was advised to take vitamin D 1000 units p.o. daily        Other   Morbid obesity (HCC)    - I encouraged the patient to lose weight.  - I educated them on making healthy dietary choices including eating more fruits and vegetables and less fried foods. - I encouraged the patient to exercise more, and educated on the benefits of exercise including weight loss, diabetes prevention, and hypertension prevention.   Dietary counseling with a registered dietician  Referral to a weight management support group (e.g. Weight Watchers, Overeaters Anonymous)  If your BMI is greater than 29 or you have gained more than 15 pounds you should work on weight loss.  Attend a healthy cooking class       Smoker - Primary    - I instructed the patient to stop smoking and provided them with smoking cessation materials.  - I informed the patient that smoking puts them at increased risk for cancer, COPD, hypertension, and more.  - Informed the patient to seek help if they begin to have trouble breathing, develop chest pain, start to cough up blood, feel faint, or pass out.      Renal colic on right side    Stable at the present time  Other Visit Diagnoses     Essential hypertension, benign           Meds ordered this encounter  Medications   ALPRAZolam (XANAX) 0.25 MG tablet    Sig: Take 1 tablet (0.25 mg total) by mouth 2 (two) times daily as needed for anxiety.    Dispense:  60 tablet    Refill:  0    Follow-up: No follow-ups on file.    Corky Downs, MD

## 2020-12-09 NOTE — Assessment & Plan Note (Signed)
Patient was started on ofloxacin 1 drop twice a day in the right ear for 5 days

## 2020-12-12 ENCOUNTER — Emergency Department
Admission: EM | Admit: 2020-12-12 | Discharge: 2020-12-12 | Disposition: A | Discharge status: Home / Self Care | Payer: BC Managed Care – PPO | Attending: Emergency Medicine | Admitting: Emergency Medicine

## 2020-12-12 ENCOUNTER — Emergency Department: Payer: BC Managed Care – PPO

## 2020-12-12 ENCOUNTER — Other Ambulatory Visit: Payer: Self-pay

## 2020-12-12 DIAGNOSIS — I10 Essential (primary) hypertension: Secondary | ICD-10-CM | POA: Diagnosis not present

## 2020-12-12 DIAGNOSIS — H7191 Unspecified cholesteatoma, right ear: Secondary | ICD-10-CM | POA: Diagnosis not present

## 2020-12-12 DIAGNOSIS — Z7901 Long term (current) use of anticoagulants: Secondary | ICD-10-CM | POA: Insufficient documentation

## 2020-12-12 DIAGNOSIS — Z96643 Presence of artificial hip joint, bilateral: Secondary | ICD-10-CM | POA: Diagnosis not present

## 2020-12-12 DIAGNOSIS — R202 Paresthesia of skin: Secondary | ICD-10-CM | POA: Insufficient documentation

## 2020-12-12 DIAGNOSIS — J449 Chronic obstructive pulmonary disease, unspecified: Secondary | ICD-10-CM | POA: Diagnosis not present

## 2020-12-12 DIAGNOSIS — F17210 Nicotine dependence, cigarettes, uncomplicated: Secondary | ICD-10-CM | POA: Insufficient documentation

## 2020-12-12 DIAGNOSIS — H9391 Unspecified disorder of right ear: Secondary | ICD-10-CM | POA: Diagnosis present

## 2020-12-12 LAB — CBC WITH DIFFERENTIAL/PLATELET
Abs Immature Granulocytes: 0.04 10*3/uL (ref 0.00–0.07)
Basophils Absolute: 0.1 10*3/uL (ref 0.0–0.1)
Basophils Relative: 1 %
Eosinophils Absolute: 0.2 10*3/uL (ref 0.0–0.5)
Eosinophils Relative: 2 %
HCT: 40.1 % (ref 36.0–46.0)
Hemoglobin: 13 g/dL (ref 12.0–15.0)
Immature Granulocytes: 0 %
Lymphocytes Relative: 33 %
Lymphs Abs: 3.5 10*3/uL (ref 0.7–4.0)
MCH: 25.7 pg — ABNORMAL LOW (ref 26.0–34.0)
MCHC: 32.4 g/dL (ref 30.0–36.0)
MCV: 79.4 fL — ABNORMAL LOW (ref 80.0–100.0)
Monocytes Absolute: 0.7 10*3/uL (ref 0.1–1.0)
Monocytes Relative: 6 %
Neutro Abs: 6.1 10*3/uL (ref 1.7–7.7)
Neutrophils Relative %: 58 %
Platelets: 315 10*3/uL (ref 150–400)
RBC: 5.05 MIL/uL (ref 3.87–5.11)
RDW: 15.9 % — ABNORMAL HIGH (ref 11.5–15.5)
WBC: 10.5 10*3/uL (ref 4.0–10.5)
nRBC: 0 % (ref 0.0–0.2)

## 2020-12-12 LAB — COMPREHENSIVE METABOLIC PANEL
ALT: 18 U/L (ref 0–44)
AST: 20 U/L (ref 15–41)
Albumin: 3.7 g/dL (ref 3.5–5.0)
Alkaline Phosphatase: 72 U/L (ref 38–126)
Anion gap: 7 (ref 5–15)
BUN: 10 mg/dL (ref 6–20)
CO2: 27 mmol/L (ref 22–32)
Calcium: 8.8 mg/dL — ABNORMAL LOW (ref 8.9–10.3)
Chloride: 105 mmol/L (ref 98–111)
Creatinine, Ser: 0.77 mg/dL (ref 0.44–1.00)
GFR, Estimated: >60 mL/min (ref >60)
Glucose, Bld: 91 mg/dL (ref 70–99)
Potassium: 3.6 mmol/L (ref 3.5–5.1)
Sodium: 139 mmol/L (ref 135–145)
Total Bilirubin: 0.9 mg/dL (ref 0.3–1.2)
Total Protein: 7.2 g/dL (ref 6.5–8.1)

## 2020-12-12 MED ORDER — CIPROFLOXACIN HCL 500 MG PO TABS: 500.0000 mg | ORAL_TABLET | Freq: Two times a day (BID) | ORAL | 0 refills | Status: AC

## 2020-12-12 MED ORDER — AMOXICILLIN-POT CLAVULANATE 875-125 MG PO TABS: 1.0000 | ORAL_TABLET | Freq: Two times a day (BID) | ORAL | 0 refills | Status: AC

## 2020-12-12 MED ORDER — PREDNISONE 10 MG PO TABS: ORAL_TABLET | ORAL | 0 refills | Status: AC

## 2020-12-12 NOTE — ED Provider Notes (Signed)
[facility] Emergency Department Provider Note  ____________________________________________   Event Date/Time   First MD Initiated Contact with Patient 12/12/20 1654     (approximate)  I have reviewed the triage vital signs and the nursing notes.   HISTORY  Chief Complaint Ear Pain and Numbness    HPI Kimberly Nixon is a 55 y.o. female with past medical history as below here with right-sided facial numbness.  The patient states that over the last several days, she has had what she describes as right-sided facial numbness.  She states she feels somewhat different on the right face throughout the upper and lower face, noticed it today but it may been present previously.  She states that she is also had some ongoing and recurrent right ear issues for which she was recently put on antibiotics.  She has aural fullness.  She is also noticing has been feeling slightly off balance lately.  She has history of recurrent ear infections.  No history of previous facial numbness or stroke.  She has a family history of stroke.  No history of cancer.  No focal numbness or weakness of the lower extremities.  No headache.  No specific alleviating or aggravating factors.    Past Medical History:  Diagnosis Date   Anemia    Anxiety    Cervical high risk human papillomavirus (HPV) DNA test positive    Depression    Family history of colon cancer in father    Headache    PONV (postoperative nausea and vomiting)     Patient Active Problem List   Diagnosis Date Noted   Chronic otitis externa of right ear 12/09/2020   S/P hip replacement 08/20/2020   Need for COVID-19 vaccine 12/24/2019   Renal colic on right side 11/26/2019   Right-sided low back pain without sciatica 11/26/2019   Smoker 09/11/2019   Essential hypertension 09/04/2019   Chronic obstructive pulmonary disease (HCC) 09/04/2019   Morbid obesity (HCC) 09/04/2019   Tendinitis involving right hip abductors  09/04/2019   Anxiety 08/21/2019   History of hysterectomy 09/18/2018   Cervical high risk human papillomavirus (HPV) DNA test positive 08/20/2018   Primary osteoarthritis of left hip 12/18/2014    Past Surgical History:  Procedure Laterality Date   ABDOMINAL HYSTERECTOMY  2003   CESAREAN SECTION  1997   CHOLECYSTECTOMY  2011   COLONOSCOPY  2016, 2021   JOINT REPLACEMENT     TOTAL HIP ARTHROPLASTY Left 12/18/2014   Procedure: TOTAL HIP ARTHROPLASTY ANTERIOR APPROACH;  Surgeon: Kennedy Bucker, MD;  Location: [facility] ORS;  Service: Orthopedics;  Laterality: Left;   TOTAL HIP ARTHROPLASTY Right 08/20/2020   Procedure: TOTAL HIP ARTHROPLASTY ANTERIOR APPROACH;  Surgeon: Kennedy Bucker, MD;  Location: [facility] ORS;  Service: Orthopedics;  Laterality: Right;    Prior to Admission medications   Medication Sig Start Date End Date Taking? Authorizing Provider  amoxicillin-clavulanate (AUGMENTIN) 875-125 MG tablet Take 1 tablet by mouth 2 (two) times daily for 10 days. 12/12/20 12/22/20 Yes Shaune Pollack, MD  ciprofloxacin (CIPRO) 500 MG tablet Take 1 tablet (500 mg total) by mouth 2 (two) times daily for 10 days. 12/12/20 12/22/20 Yes Shaune Pollack, MD  predniSONE (DELTASONE) 10 MG tablet Take 6 tablets (60 mg total) by mouth daily for 2 days, THEN 4 tablets (40 mg total) daily for 2 days, THEN 2 tablets (20 mg total) daily for 2 days, THEN 1 tablet (10 mg total) daily for 2 days. 12/12/20 12/20/20 Yes Shaune Pollack, MD  acetaminophen (TYLENOL) 325 MG tablet Take 1-2 tablets (325-650 mg total) by mouth every 6 (six) hours as needed for mild pain (pain score 1-3 or temp > 100.5). 08/21/20   Evon Slack, PA-C  ALPRAZolam Prudy Feeler) 0.25 MG tablet Take 1 tablet (0.25 mg total) by mouth 2 (two) times daily as needed for anxiety. 12/09/20   Corky Downs, MD  docusate sodium (COLACE) 100 MG capsule Take 1 capsule (100 mg total) by mouth 2 (two) times daily. 08/21/20   Evon Slack, PA-C  enoxaparin (LOVENOX) 40  MG/0.4ML injection Inject 0.4 mLs (40 mg total) into the skin daily. 08/22/20   Evon Slack, PA-C  gabapentin (NEURONTIN) 300 MG capsule Take 300 mg by mouth at bedtime as needed (pain).    [provider]  methocarbamol (ROBAXIN) 500 MG tablet Take 1 tablet (500 mg total) by mouth every 6 (six) hours as needed for muscle spasms. 08/21/20   Evon Slack, PA-C  oxyCODONE (OXY IR/ROXICODONE) 5 MG immediate release tablet Take 1-2 tablets (5-10 mg total) by mouth every 4 (four) hours as needed for moderate pain (pain score 4-6). 08/21/20   Evon Slack, PA-C  traMADol (ULTRAM) 50 MG tablet Take 1 tablet (50 mg total) by mouth every 6 (six) hours as needed. 08/21/20   Evon Slack, PA-C    Allergies Patient has no known allergies.  Family History  Problem Relation Age of Onset   Colon cancer Father 89   Breast cancer Maternal Grandmother 29   Ovarian cancer Neg Hx     Social History Social History   Tobacco Use   Smoking status: Every Day    Packs/day: 0.25    Types: Cigarettes   Smokeless tobacco: Never  Vaping Use   Vaping Use: Never used  Substance Use Topics   Alcohol use: Not Currently    Comment: rarely   Drug use: No    Review of Systems  Review of Systems  Constitutional:  Positive for fatigue. Negative for fever.  HENT:  Positive for ear pain and hearing loss. Negative for congestion and sore throat.   Eyes:  Negative for visual disturbance.  Respiratory:  Negative for cough and shortness of breath.   Cardiovascular:  Negative for chest pain.  Gastrointestinal:  Negative for abdominal pain, diarrhea, nausea and vomiting.  Genitourinary:  Negative for flank pain.  Musculoskeletal:  Negative for back pain and neck pain.  Skin:  Negative for rash and wound.  Neurological:  Positive for numbness. Negative for weakness.  All other systems reviewed and are negative.   ____________________________________________  PHYSICAL EXAM:      VITAL  SIGNS: ED Triage Vitals  Enc Vitals Group     BP 12/12/20 1514 128/84     Pulse Rate 12/12/20 1514 85     Resp 12/12/20 1514 18     Temp 12/12/20 1514 98.5 F (36.9 C)     Temp Source 12/12/20 1514 Oral     SpO2 12/12/20 1514 97 %     Weight 12/12/20 1515 260 lb (117.9 kg)     Height 12/12/20 1515 5\' 8"  (1.727 m)     Head Circumference --      Peak Flow --      Pain Score 12/12/20 1515 7     Pain Loc --      Pain Edu? --      Excl. in GC? --      Physical Exam Vitals and nursing note reviewed.  Constitutional:      General: She is not in acute distress.    Appearance: She is well-developed.  HENT:     Head: Normocephalic and atraumatic.     Comments: Apparent cholesteatoma in the right external auditory canal, with some local tissue erosion.  No erythema.  This obscures the tympanic membrane.  No mastoid tenderness or erythema.  Left TM and external canal normal. Eyes:     Conjunctiva/sclera: Conjunctivae normal.  Cardiovascular:     Rate and Rhythm: Normal rate and regular rhythm.     Heart sounds: Normal heart sounds. No murmur heard.   No friction rub.  Pulmonary:     Effort: Pulmonary effort is normal. No respiratory distress.     Breath sounds: Normal breath sounds. No wheezing or rales.  Abdominal:     General: There is no distension.     Palpations: Abdomen is soft.     Tenderness: There is no abdominal tenderness.  Musculoskeletal:     Cervical back: Neck supple.  Skin:    General: Skin is warm.     Capillary Refill: Capillary refill takes less than 2 seconds.     Findings: No rash.  Neurological:     Mental Status: She is alert and oriented to person, place, and time.     Motor: No abnormal muscle tone.     Comments: Neurological Exam:  Mental Status: Alert and oriented to person, place, and time. Attention and concentration normal. Speech clear. Recent memory is intact. Cranial Nerves: Visual fields grossly intact. EOMI and PERRLA. No nystagmus noted.  Facial sensation intact at forehead, maxillary cheek, and chin/mandible bl, though subjectively diminished in R V1-V3 distribution. No facial asymmetry or weakness. Hearing grossly normal. Uvula is midline, and palate elevates symmetrically. Normal SCM and trapezius strength. Tongue midline without fasciculations. Motor: Muscle strength 5/5 in proximal and distal UE and LE bilaterally. No pronator drift. Muscle tone normal. Sensation: Intact to light touch in upper and lower extremities distally bilaterally.  Coordination: Normal FTN bilaterally.  '       ____________________________________________   LABS (all labs ordered are listed, but only abnormal results are displayed)  Labs Reviewed  CBC WITH DIFFERENTIAL/PLATELET - Abnormal; Notable for the following components:      Result Value   MCV 79.4 (*)    MCH 25.7 (*)    RDW 15.9 (*)    All other components within normal limits  COMPREHENSIVE METABOLIC PANEL - Abnormal; Notable for the following components:   Calcium 8.8 (*)    All other components within normal limits    ___________________________  RADIOLOGY All imaging, including plain films, CT scans, and ultrasounds, independently reviewed by me, and interpretations confirmed via formal radiology reads.  ED MD interpretation:   CT temporal bone: Abnormal soft tissue in the right middle ear cavity consistent with cholesteatoma, right mastoid effusion  Official radiology report(s): MR BRAIN WO CONTRAST  Result Date: 12/12/2020 CLINICAL DATA:  Initial evaluation for dizziness, right-sided facial numbness. EXAM: MRI HEAD WITHOUT CONTRAST TECHNIQUE: Multiplanar, multiecho pulse sequences of the brain and surrounding structures were obtained without intravenous contrast. COMPARISON:  Prior temporal bone CT performed earlier on the same day. FINDINGS: Brain: Cerebral volume within normal limits. No focal parenchymal signal abnormality or significant cerebral white matter disease.  No abnormal foci of restricted diffusion to suggest acute or subacute ischemia. Gray-white matter differentiation maintained. No encephalomalacia to suggest chronic cortical infarction. No evidence for acute or chronic intracranial hemorrhage. No mass  lesion, midline shift or mass effect. No hydrocephalus or extra-axial fluid collection. Pituitary gland suprasellar region within normal limits. Midline structures intact and normal. Vascular: Major intracranial vascular flow voids are maintained. Skull and upper cervical spine: Craniocervical junction within normal limits. Bone marrow signal intensity normal. No scalp soft tissue abnormality. Sinuses/Orbits: Globes and orbital soft tissues are within normal limits. Few small maxillary sinus retention cyst noted. Paranasal sinuses are otherwise clear. Right mastoid and middle ear effusion, better evaluated on prior temporal bone CT. Other: None. IMPRESSION: 1. Normal brain MRI. No acute intracranial abnormality identified. 2. Right mastoid and middle ear density/effusion, better evaluated on prior temporal bone CT. Electronically Signed   By: Rise Mu M.D.   On: 12/12/2020 20:32   CT Temporal Bones Wo Contrast  Result Date: 12/12/2020 CLINICAL DATA:  Vertigo, peripheral.  Ear pain and facial numbness. EXAM: CT TEMPORAL BONES WITHOUT CONTRAST TECHNIQUE: Axial and coronal plane CT imaging of the petrous temporal bones was performed with thin-collimation image reconstruction. No intravenous contrast was administered. Multiplanar CT image reconstructions were also generated. COMPARISON:  Head CT 04/16/2020 FINDINGS: RIGHT TEMPORAL BONE External auditory canal: Abnormal soft tissue in the medial and superior aspects of the EAC inseparable from the tympanic membrane. Middle ear cavity: Small to moderate volume soft tissue in the epitympanum and mesotympanum including in Prussak's space with blunting of the scutum (series 6, image 90). Soft tissue partially  surrounds the ossicles without definite erosion of the malleus or incus. The stapes is poorly visualized due to surrounding soft tissue. The tegmen tympani is grossly intact. Inner ear structures: The cochlea, vestibule and semicircular canals are normal. The vestibular aqueduct is not enlarged. Internal auditory and facial nerve canals: Hyperostosis narrows the porus acusticus. Thin or absent bone overlying the tympanic segment of the facial nerve. Mastoid air cells: Moderate mastoid air cell opacification without coalescence. LEFT TEMPORAL BONE External auditory canal: Normal. Middle ear cavity: Normally aerated. The scutum and ossicles are normal. The tegmen tympani is intact. Inner ear structures: The cochlea, vestibule and semicircular canals are normal. The vestibular aqueduct is not enlarged. Internal auditory and facial nerve canals: Hyperostosis narrows the porus acusticus. Thin or absent bone overlying the tympanic segment of the facial nerve. Mastoid air cells: Clear. Vascular: Normal non-contrast appearance of the carotid canals, jugular bulbs and sigmoid plates. Limited intracranial:  Unremarkable. Visible orbits/paranasal sinuses: Unremarkable included orbits. Small mucous retention cysts in the right greater than left maxillary sinuses. Thin or dehiscent bone along the inferior aspect of the posterior wall of the left maxillary sinus. Soft tissues: Unremarkable. IMPRESSION: 1. Abnormal soft tissue in the right middle ear cavity and along the tympanic membrane in the EAC with early erosion of the scutum suspicious for cholesteatoma. 2. Moderate right mastoid effusion. Electronically Signed   By: Sebastian Ache M.D.   On: 12/12/2020 18:37    ____________________________________________  PROCEDURES   Procedure(s) performed (including Critical Care):  Procedures  ____________________________________________  INITIAL IMPRESSION / MDM / ASSESSMENT AND PLAN / ED COURSE  As part of my medical  decision making, I reviewed the following data within the electronic MEDICAL RECORD NUMBER Nursing notes reviewed and incorporated, Old chart reviewed, Notes from prior ED visits, and [place] Controlled Substance Database       *Kimberly Nixon was evaluated in Emergency Department on 12/12/2020 for the symptoms described in the history of present illness. She was evaluated in the context of the global COVID-19 pandemic, which necessitated consideration that the patient  might be at risk for infection with the SARS-CoV-2 virus that causes COVID-19. Institutional protocols and algorithms that pertain to the evaluation of patients at risk for COVID-19 are in a state of rapid change based on information released by regulatory bodies including the CDC and federal and state organizations. These policies and algorithms were followed during the patient's care in the ED.  Some ED evaluations and interventions may be delayed as a result of limited staffing during the pandemic.*     Medical Decision Making:  55 yo F here with R facial numbness, R ear pain. Re: her ear pain, pt has h/o recurrent AOM and exam is consistent with cholesteatoma. CT Temporal Bones obtained and confirms this. There is a small mastoid effusion but pt has no fever, no mastoid tenderness/erythema, do not suspect mastoiditis clinically. This does not, however, explain her trigeminal sx - MRI subsequently obtained, reviewed, and is negative. No signs of CVA. Query trigeminal neuralgia/paresthesia. No other neuro deficits noted. Labs are reassuring.  Discussed CT findings w/ Dr. Jenne Campus. Will tx with prednisone, which incidentally might also help possible TGN, in addition to a broad-spectrum course of abx with pseudomonal coverage. Pt is o/w nontoxic, well appearing, and in NAD. Will advise outpt ENT f/u.  ____________________________________________  FINAL CLINICAL IMPRESSION(S) / ED DIAGNOSES  Final diagnoses:  Cholesteatoma of right ear   Paresthesia     MEDICATIONS GIVEN DURING THIS VISIT:  Medications - No data to display   ED Discharge Orders          Ordered    predniSONE (DELTASONE) 10 MG tablet        12/12/20 2038    ciprofloxacin (CIPRO) 500 MG tablet  2 times daily        12/12/20 2038    amoxicillin-clavulanate (AUGMENTIN) 875-125 MG tablet  2 times daily        12/12/20 2038             Note:  This document was prepared using Dragon voice recognition software and may include unintentional dictation errors.   Shaune Pollack, MD 12/12/20 2244

## 2020-12-12 NOTE — Discharge Instructions (Addendum)
Your imaging showed a cholesteatoma, which could explain many of your ear symptoms  We are going to start a stronger oral course of antibiotics for possible infection/inflammation  We are also going to start prednisone to help with inflammation  MRI was otherwise very reassuring  Follow-up with ENT in the next 1-1.5 weeks

## 2020-12-12 NOTE — ED Notes (Signed)
Pt recently treated for ear infection, describes lower jaw/facial numbness and mild headache. No improvement with ear pain.

## 2020-12-12 NOTE — ED Triage Notes (Addendum)
Pt comes pov with ear pain and facial numbness. States across entire face feels numb. Able to smile, raise eyebrows, stick out tongue without difficulty. No other neuro deficits present. Had round of amoxicillin for ear a month ago. States it keeps flaring back up.

## 2020-12-14 ENCOUNTER — Other Ambulatory Visit: Payer: Self-pay | Admitting: *Deleted

## 2020-12-14 ENCOUNTER — Ambulatory Visit: Payer: BC Managed Care – PPO | Admitting: *Deleted

## 2020-12-14 ENCOUNTER — Other Ambulatory Visit: Payer: Self-pay

## 2020-12-14 DIAGNOSIS — I10 Essential (primary) hypertension: Secondary | ICD-10-CM

## 2020-12-14 DIAGNOSIS — E569 Vitamin deficiency, unspecified: Secondary | ICD-10-CM

## 2020-12-14 LAB — POCT URINALYSIS DIPSTICK
Appearance: NORMAL
Bilirubin, UA: NEGATIVE
Blood, UA: NEGATIVE
Glucose, UA: NEGATIVE
Leukocytes, UA: NEGATIVE
Nitrite, UA: NEGATIVE
Protein, UA: NEGATIVE
Spec Grav, UA: 1.015 (ref 1.010–1.025)
Urobilinogen, UA: 0.2 E.U./dL
pH, UA: 6 (ref 5.0–8.0)

## 2020-12-14 MED ORDER — FLUCONAZOLE 150 MG PO TABS: 150.0000 mg | ORAL_TABLET | Freq: Once | ORAL | 0 refills | Status: AC

## 2020-12-14 NOTE — Addendum Note (Signed)
Addended by: Jobie Quaker on: 12/14/2020 08:58 AM   Modules accepted: Orders

## 2020-12-15 LAB — CBC WITH DIFFERENTIAL/PLATELET
Absolute Monocytes: 1178 cells/uL — ABNORMAL HIGH (ref 200–950)
Basophils Absolute: 38 cells/uL (ref 0–200)
Basophils Relative: 0.3 %
Eosinophils Absolute: 13 cells/uL — ABNORMAL LOW (ref 15–500)
Eosinophils Relative: 0.1 %
HCT: 41.8 % (ref 35.0–45.0)
Hemoglobin: 13.3 g/dL (ref 11.7–15.5)
Lymphs Abs: 2918 cells/uL (ref 850–3900)
MCH: 24.8 pg — ABNORMAL LOW (ref 27.0–33.0)
MCHC: 31.8 g/dL — ABNORMAL LOW (ref 32.0–36.0)
MCV: 78 fL — ABNORMAL LOW (ref 80.0–100.0)
MPV: 10.5 fL (ref 7.5–12.5)
Monocytes Relative: 9.2 %
Neutro Abs: 8653 cells/uL — ABNORMAL HIGH (ref 1500–7800)
Neutrophils Relative %: 67.6 %
Platelets: 346 10*3/uL (ref 140–400)
RBC: 5.36 10*6/uL — ABNORMAL HIGH (ref 3.80–5.10)
RDW: 15.4 % — ABNORMAL HIGH (ref 11.0–15.0)
Total Lymphocyte: 22.8 %
WBC: 12.8 10*3/uL — ABNORMAL HIGH (ref 3.8–10.8)

## 2020-12-15 LAB — COMPLETE METABOLIC PANEL WITH GFR
AG Ratio: 1.3 (calc) (ref 1.0–2.5)
ALT: 15 U/L (ref 6–29)
AST: 12 U/L (ref 10–35)
Albumin: 3.9 g/dL (ref 3.6–5.1)
Alkaline phosphatase (APISO): 81 U/L (ref 37–153)
BUN: 11 mg/dL (ref 7–25)
CO2: 23 mmol/L (ref 20–32)
Calcium: 9.6 mg/dL (ref 8.6–10.4)
Chloride: 106 mmol/L (ref 98–110)
Creat: 0.71 mg/dL (ref 0.50–1.03)
Globulin: 2.9 g/dL (calc) (ref 1.9–3.7)
Glucose, Bld: 94 mg/dL (ref 65–99)
Potassium: 3.8 mmol/L (ref 3.5–5.3)
Sodium: 139 mmol/L (ref 135–146)
Total Bilirubin: 0.3 mg/dL (ref 0.2–1.2)
Total Protein: 6.8 g/dL (ref 6.1–8.1)
eGFR: 102 mL/min/{1.73_m2} (ref >=60)

## 2020-12-15 LAB — TSH: TSH: 2.52 mIU/L

## 2020-12-15 LAB — LIPID PANEL
Cholesterol: 236 mg/dL — ABNORMAL HIGH (ref <200)
HDL: 37 mg/dL — ABNORMAL LOW (ref >=50)
LDL Cholesterol (Calc): 163 mg/dL (calc) — ABNORMAL HIGH
Non-HDL Cholesterol (Calc): 199 mg/dL (calc) — ABNORMAL HIGH (ref <130)
Total CHOL/HDL Ratio: 6.4 (calc) — ABNORMAL HIGH (ref <5.0)
Triglycerides: 196 mg/dL — ABNORMAL HIGH (ref <150)

## 2020-12-30 NOTE — Addendum Note (Signed)
Addended by: Jobie Quaker on: 12/30/2020 03:57 PM   Modules accepted: Orders

## 2020-12-30 NOTE — Addendum Note (Signed)
Addended by: Jobie Quaker on: 12/30/2020 04:04 PM   Modules accepted: Orders

## 2021-01-03 LAB — VITAMIN D 1,25 DIHYDROXY
Vitamin D 1, 25 (OH)2 Total: 91 pg/mL — ABNORMAL HIGH (ref 18–72)
Vitamin D2 1, 25 (OH)2: <8 pg/mL
Vitamin D3 1, 25 (OH)2: 91 pg/mL

## 2021-01-03 LAB — B12 AND FOLATE PANEL
Folate: 11 ng/mL
Vitamin B-12: 342 pg/mL (ref 200–1100)

## 2021-01-11 ENCOUNTER — Encounter: Payer: Self-pay | Admitting: Internal Medicine

## 2021-01-11 ENCOUNTER — Other Ambulatory Visit: Payer: Self-pay

## 2021-01-11 ENCOUNTER — Ambulatory Visit (INDEPENDENT_AMBULATORY_CARE_PROVIDER_SITE_OTHER): Payer: BC Managed Care – PPO | Admitting: Internal Medicine

## 2021-01-11 VITALS — BP 138/80 | HR 90 | Ht 68.0 in | Wt 276.7 lb

## 2021-01-11 DIAGNOSIS — E785 Hyperlipidemia, unspecified: Secondary | ICD-10-CM

## 2021-01-11 DIAGNOSIS — Z96643 Presence of artificial hip joint, bilateral: Secondary | ICD-10-CM

## 2021-01-11 DIAGNOSIS — J449 Chronic obstructive pulmonary disease, unspecified: Secondary | ICD-10-CM | POA: Diagnosis not present

## 2021-01-11 DIAGNOSIS — R87810 Cervical high risk human papillomavirus (HPV) DNA test positive: Secondary | ICD-10-CM

## 2021-01-11 DIAGNOSIS — I10 Essential (primary) hypertension: Secondary | ICD-10-CM | POA: Diagnosis not present

## 2021-01-11 DIAGNOSIS — M545 Low back pain, unspecified: Secondary | ICD-10-CM

## 2021-01-11 DIAGNOSIS — F17200 Nicotine dependence, unspecified, uncomplicated: Secondary | ICD-10-CM

## 2021-01-11 MED ORDER — ROSUVASTATIN CALCIUM 10 MG PO TABS: 10.0000 mg | ORAL_TABLET | Freq: Every day | ORAL | 3 refills | Status: DC

## 2021-01-11 NOTE — Assessment & Plan Note (Signed)
-   I instructed the patient to stop smoking and provided them with smoking cessation materials.  - I informed the patient that smoking puts them at increased risk for cancer, COPD, hypertension, and more.  - Informed the patient to seek help if they begin to have trouble breathing, develop chest pain, start to cough up blood, feel faint, or pass out.  

## 2021-01-11 NOTE — Assessment & Plan Note (Signed)
Behavioral modification strategies: increasing lean protein intake, decreasing simple carbohydrates, increasing vegetables, increasing water intake, decreasing eating out, no skipping meals, meal planning and cooking strategies, keeping healthy foods in the home and planning for success. 

## 2021-01-11 NOTE — Assessment & Plan Note (Signed)
Patient was advised to lose weight 

## 2021-01-11 NOTE — Assessment & Plan Note (Signed)
-   Patient's back pain is under control with medication.  - Encouraged the patient to stretch or do yoga as able to help with back pain 

## 2021-01-11 NOTE — Assessment & Plan Note (Signed)
Patient has bilateral hip replacement she is doing well advised to lose weight.  And quit smoking

## 2021-01-11 NOTE — Assessment & Plan Note (Signed)

## 2021-01-11 NOTE — Assessment & Plan Note (Signed)
Refer to OB/GYN 

## 2021-01-11 NOTE — Progress Notes (Signed)
Established Patient Office Visit  Subjective:  Patient ID: Kimberly Nixon, female    DOB: 03-02-1966  Age: 55 y.o. MRN: [id number]  CC:  Chief Complaint  Patient presents with  . Follow-up    Lab results    HPI  Kimberly Nixon presents for general checkup and lab review  Past Medical History:  Diagnosis Date  . Anemia   . Anxiety   . Cervical high risk human papillomavirus (HPV) DNA test positive   . Depression   . Family history of colon cancer in father   . Headache   . PONV (postoperative nausea and vomiting)     Past Surgical History:  Procedure Laterality Date  . ABDOMINAL HYSTERECTOMY  2003  . CESAREAN SECTION  1997  . CHOLECYSTECTOMY  2011  . COLONOSCOPY  2016, 2021  . JOINT REPLACEMENT    . TOTAL HIP ARTHROPLASTY Left 12/18/2014   Procedure: TOTAL HIP ARTHROPLASTY ANTERIOR APPROACH;  Surgeon: Hessie Knows, MD;  Location: [facility] ORS;  Service: Orthopedics;  Laterality: Left;  . TOTAL HIP ARTHROPLASTY Right 08/20/2020   Procedure: TOTAL HIP ARTHROPLASTY ANTERIOR APPROACH;  Surgeon: Hessie Knows, MD;  Location: [facility] ORS;  Service: Orthopedics;  Laterality: Right;    Family History  Problem Relation Age of Onset  . Colon cancer Father 43  . Breast cancer Maternal Grandmother 81  . Ovarian cancer Neg Hx     Social History   Socioeconomic History  . Marital status: Divorced    Spouse name: Not on file  . Number of children: 1  . Years of education: Not on file  . Highest education level: Not on file  Occupational History  . Not on file  Tobacco Use  . Smoking status: Every Day    Packs/day: 0.25    Types: Cigarettes  . Smokeless tobacco: Never  Vaping Use  . Vaping Use: Never used  Substance and Sexual Activity  . Alcohol use: Not Currently    Comment: rarely  . Drug use: No  . Sexual activity: Yes    Birth control/protection: Surgical    Comment: Hysterectomy  Other Topics Concern  . Not on file  Social History Narrative  . Not on file    Social Determinants of Health   Financial Resource Strain: Not on file  Food Insecurity: Not on file  Transportation Needs: Not on file  Physical Activity: Not on file  Stress: Not on file  Social Connections: Not on file  Intimate Partner Violence: Not on file     Current Outpatient Medications:  .  rosuvastatin (CRESTOR) 10 MG tablet, Take 1 tablet (10 mg total) by mouth daily., Disp: 90 tablet, Rfl: 3 .  acetaminophen (TYLENOL) 325 MG tablet, Take 1-2 tablets (325-650 mg total) by mouth every 6 (six) hours as needed for mild pain (pain score 1-3 or temp > 100.5)., Disp: , Rfl:  .  ALPRAZolam (XANAX) 0.25 MG tablet, Take 1 tablet (0.25 mg total) by mouth 2 (two) times daily as needed for anxiety., Disp: 60 tablet, Rfl: 0 .  docusate sodium (COLACE) 100 MG capsule, Take 1 capsule (100 mg total) by mouth 2 (two) times daily., Disp: 10 capsule, Rfl: 0 .  enoxaparin (LOVENOX) 40 MG/0.4ML injection, Inject 0.4 mLs (40 mg total) into the skin daily., Disp: 5.6 mL, Rfl: 0 .  gabapentin (NEURONTIN) 300 MG capsule, Take 300 mg by mouth at bedtime as needed (pain)., Disp: , Rfl:  .  methocarbamol (ROBAXIN) 500 MG tablet, Take 1 tablet (500  mg total) by mouth every 6 (six) hours as needed for muscle spasms., Disp: 30 tablet, Rfl: 0 .  oxyCODONE (OXY IR/ROXICODONE) 5 MG immediate release tablet, Take 1-2 tablets (5-10 mg total) by mouth every 4 (four) hours as needed for moderate pain (pain score 4-6)., Disp: 30 tablet, Rfl: 0 .  traMADol (ULTRAM) 50 MG tablet, Take 1 tablet (50 mg total) by mouth every 6 (six) hours as needed., Disp: 30 tablet, Rfl: 0   No Known Allergies  ROS Review of Systems  Constitutional: Negative.   HENT: Negative.    Eyes: Negative.   Respiratory: Negative.    Cardiovascular: Negative.   Gastrointestinal: Negative.   Endocrine: Negative.   Genitourinary: Negative.   Musculoskeletal: Negative.   Skin: Negative.   Allergic/Immunologic: Negative.   Neurological:  Negative.   Hematological: Negative.   Psychiatric/Behavioral: Negative.    All other systems reviewed and are negative.    Objective:    Physical Exam Vitals reviewed.  Constitutional:      Appearance: Normal appearance.  HENT:     Mouth/Throat:     Mouth: Mucous membranes are moist.  Eyes:     Pupils: Pupils are equal, round, and reactive to light.  Neck:     Vascular: No carotid bruit.  Cardiovascular:     Rate and Rhythm: Normal rate and regular rhythm.     Pulses: Normal pulses.     Heart sounds: Normal heart sounds.  Pulmonary:     Effort: Pulmonary effort is normal.     Breath sounds: Normal breath sounds.  Abdominal:     General: Bowel sounds are normal.     Palpations: Abdomen is soft. There is no hepatomegaly, splenomegaly or mass.     Tenderness: There is no abdominal tenderness.     Hernia: No hernia is present.  Musculoskeletal:        General: No tenderness.     Cervical back: Neck supple.     Right lower leg: No edema.     Left lower leg: No edema.  Skin:    Findings: No rash.  Neurological:     Mental Status: She is alert and oriented to person, place, and time.     Motor: No weakness.  Psychiatric:        Mood and Affect: Mood and affect normal.        Behavior: Behavior normal.    BP 138/80   Pulse 90   Ht _0  (1.727 m)   Wt 276 lb 11.2 oz (125.5 kg)   BMI 42.07 kg/m  Wt Readings from Last 3 Encounters:  01/11/21 276 lb 11.2 oz (125.5 kg)  12/12/20 260 lb (117.9 kg)  12/09/20 271 lb 3.2 oz (123 kg)     Health Maintenance Due  Topic Date Due  . Pneumococcal Vaccine 26-41 Years old (1 - PCV) Never done  . HIV Screening  Never done  . Hepatitis C Screening  Never done  . COLONOSCOPY (Pts 45-13yr Insurance coverage will need to be confirmed)  Never done  . Zoster Vaccines- Shingrix (1 of 2) Never done  . TETANUS/TDAP  02/12/2020  . COVID-19 Vaccine (4 - Booster for Pfizer series) 02/19/2020  . INFLUENZA VACCINE  Never done     There are no preventive care reminders to display for this patient.  Lab Results  Component Value Date   TSH 2.52 12/14/2020   Lab Results  Component Value Date   WBC 12.8 (H) 12/14/2020   HGB  13.3 12/14/2020   HCT 41.8 12/14/2020   MCV 78.0 (L) 12/14/2020   PLT 346 12/14/2020   Lab Results  Component Value Date   NA 139 12/14/2020   K 3.8 12/14/2020   CO2 23 12/14/2020   GLUCOSE 94 12/14/2020   BUN 11 12/14/2020   CREATININE 0.71 12/14/2020   BILITOT 0.3 12/14/2020   ALKPHOS 72 12/12/2020   AST 12 12/14/2020   ALT 15 12/14/2020   PROT 6.8 12/14/2020   ALBUMIN 3.7 12/12/2020   CALCIUM 9.6 12/14/2020   ANIONGAP 7 12/12/2020   EGFR 102 12/14/2020   Lab Results  Component Value Date   CHOL 236 (H) 12/14/2020   Lab Results  Component Value Date   HDL 37 (L) 12/14/2020   Lab Results  Component Value Date   LDLCALC 163 (H) 12/14/2020   Lab Results  Component Value Date   TRIG 196 (H) 12/14/2020   Lab Results  Component Value Date   CHOLHDL 6.4 (H) 12/14/2020   Lab Results  Component Value Date   HGBA1C 6.0 09/28/2012      Assessment & Plan:   Problem List Items Addressed This Visit       Cardiovascular and Mediastinum   Essential hypertension    The following hypertensive lifestyle modification were recommended and discussed:  1. Limiting alcohol intake to less than 1 oz/day of ethanol:(24 oz of beer or 8 oz of wine or 2 oz of 100-proof whiskey). 2. Take baby ASA 81 mg daily. 3. Importance of regular aerobic exercise and losing weight. 4. Reduce dietary saturated fat and cholesterol intake for overall cardiovascular health. 5. Maintaining adequate dietary potassium, calcium, and magnesium intake. 6. Regular monitoring of the blood pressure. 7. Reduce sodium intake to less than 100 mmol/day (less than 2.3 gm of sodium or less than 6 gm of sodium choride)       Relevant Medications   rosuvastatin (CRESTOR) 10 MG tablet     Respiratory    Chronic obstructive pulmonary disease ([place])    Patient was advised to lose weight        Other   Cervical high risk human papillomavirus (HPV) DNA test positive    Refer to OB/GYN      Morbid obesity ([place])    Behavioral modification strategies: increasing lean protein intake, decreasing simple carbohydrates, increasing vegetables, increasing water intake, decreasing eating out, no skipping meals, meal planning and cooking strategies, keeping healthy foods in the home and planning for success.      Smoker    - I instructed the patient to stop smoking and provided them with smoking cessation materials.  - I informed the patient that smoking puts them at increased risk for cancer, COPD, hypertension, and more.  - Informed the patient to seek help if they begin to have trouble breathing, develop chest pain, start to cough up blood, feel faint, or pass out.      Right-sided low back pain without sciatica    - Patient's back pain is under control with medication.  - Encouraged the patient to stretch or do yoga as able to help with back pain      S/P hip replacement    Patient has bilateral hip replacement she is doing well advised to lose weight.  And quit smoking      Other Visit Diagnoses     Dyslipidemia    -  Primary   Relevant Medications   rosuvastatin (CRESTOR) 10 MG tablet  Hypercholesterolemia  I advised the patient to follow Mediterranean diet This diet is rich in fruits vegetables and whole grain, and This diet is also rich in fish and lean meat Patient should also eat a handful of almonds or walnuts daily Recent heart study indicated that average follow-up on this kind of diet reduces the cardiovascular mortality by 50 to 70%==  Meds ordered this encounter  Medications  . rosuvastatin (CRESTOR) 10 MG tablet    Sig: Take 1 tablet (10 mg total) by mouth daily.    Dispense:  90 tablet    Refill:  3    Follow-up: No follow-ups on file.    Cletis Athens,  MD

## 2021-01-11 NOTE — Addendum Note (Signed)
Addended by: Jobie Quaker on: 01/11/2021 04:47 PM   Modules accepted: Orders

## 2021-01-12 LAB — PTH, INTACT AND CALCIUM: Calcium: 8.8 mg/dL (ref 8.6–10.4)

## 2021-01-12 LAB — PHOSPHORUS: Phosphorus: 3.4 mg/dL (ref 2.5–4.5)

## 2021-01-12 LAB — SPECIMEN COMPROMISED

## 2021-01-13 ENCOUNTER — Other Ambulatory Visit: Payer: Self-pay

## 2021-01-13 ENCOUNTER — Other Ambulatory Visit: Payer: BC Managed Care – PPO

## 2021-01-13 DIAGNOSIS — E785 Hyperlipidemia, unspecified: Secondary | ICD-10-CM

## 2021-01-15 ENCOUNTER — Other Ambulatory Visit: Payer: Self-pay | Admitting: Unknown Physician Specialty

## 2021-01-15 DIAGNOSIS — H7101 Cholesteatoma of attic, right ear: Secondary | ICD-10-CM

## 2021-01-18 LAB — SPECIMEN COMPROMISED

## 2021-01-18 LAB — PHOSPHORUS: Phosphorus: 3.6 mg/dL (ref 2.5–4.5)

## 2021-01-18 LAB — PTH, INTACT AND CALCIUM: Calcium: 9.3 mg/dL (ref 8.6–10.4)

## 2021-02-08 ENCOUNTER — Other Ambulatory Visit: Payer: Self-pay

## 2021-02-08 ENCOUNTER — Ambulatory Visit
Admission: RE | Admit: 2021-02-08 | Discharge: 2021-02-08 | Disposition: A | Discharge status: Home / Self Care | Payer: BC Managed Care – PPO | Source: Ambulatory Visit | Attending: Unknown Physician Specialty | Admitting: Unknown Physician Specialty

## 2021-02-08 DIAGNOSIS — H7101 Cholesteatoma of attic, right ear: Secondary | ICD-10-CM

## 2021-03-03 ENCOUNTER — Other Ambulatory Visit: Payer: Self-pay | Admitting: *Deleted

## 2021-03-03 MED ORDER — AZITHROMYCIN 250 MG PO TABS: ORAL_TABLET | ORAL | 0 refills | Status: AC

## 2021-03-06 ENCOUNTER — Other Ambulatory Visit: Payer: Self-pay | Admitting: Internal Medicine

## 2021-03-18 ENCOUNTER — Other Ambulatory Visit: Payer: Self-pay | Admitting: Unknown Physician Specialty

## 2021-03-18 DIAGNOSIS — R42 Dizziness and giddiness: Secondary | ICD-10-CM

## 2021-03-25 ENCOUNTER — Ambulatory Visit
Admission: RE | Admit: 2021-03-25 | Discharge: 2021-03-25 | Disposition: A | Discharge status: Home / Self Care | Payer: BC Managed Care – PPO | Source: Ambulatory Visit | Attending: Unknown Physician Specialty | Admitting: Unknown Physician Specialty

## 2021-03-25 DIAGNOSIS — R42 Dizziness and giddiness: Secondary | ICD-10-CM

## 2021-03-25 MED ORDER — GADOBENATE DIMEGLUMINE 529 MG/ML IV SOLN
20.0000 mL | Freq: Once | INTRAVENOUS | Status: AC | PRN
  Administered 2021-03-25: 20 mL via INTRAVENOUS

## 2021-03-30 ENCOUNTER — Ambulatory Visit (INDEPENDENT_AMBULATORY_CARE_PROVIDER_SITE_OTHER): Payer: BC Managed Care – PPO | Admitting: Obstetrics and Gynecology

## 2021-03-30 ENCOUNTER — Other Ambulatory Visit: Payer: Self-pay

## 2021-03-30 ENCOUNTER — Other Ambulatory Visit (HOSPITAL_COMMUNITY)
Admission: RE | Admit: 2021-03-30 | Discharge: 2021-03-30 | Disposition: A | Discharge status: Home / Self Care | Payer: BC Managed Care – PPO | Source: Ambulatory Visit | Attending: Obstetrics and Gynecology | Admitting: Obstetrics and Gynecology

## 2021-03-30 ENCOUNTER — Encounter: Payer: Self-pay | Admitting: Obstetrics and Gynecology

## 2021-03-30 VITALS — BP 128/80 | Ht 68.0 in | Wt 282.0 lb

## 2021-03-30 DIAGNOSIS — R87811 Vaginal high risk human papillomavirus (HPV) DNA test positive: Secondary | ICD-10-CM | POA: Diagnosis not present

## 2021-03-30 DIAGNOSIS — Z124 Encounter for screening for malignant neoplasm of cervix: Secondary | ICD-10-CM | POA: Diagnosis not present

## 2021-03-30 DIAGNOSIS — Z1231 Encounter for screening mammogram for malignant neoplasm of breast: Secondary | ICD-10-CM

## 2021-03-30 DIAGNOSIS — N951 Menopausal and female climacteric states: Secondary | ICD-10-CM

## 2021-03-30 DIAGNOSIS — F419 Anxiety disorder, unspecified: Secondary | ICD-10-CM

## 2021-03-30 DIAGNOSIS — Z780 Asymptomatic menopausal state: Secondary | ICD-10-CM

## 2021-03-30 DIAGNOSIS — Z01419 Encounter for gynecological examination (general) (routine) without abnormal findings: Secondary | ICD-10-CM

## 2021-03-30 MED ORDER — ALPRAZOLAM 0.25 MG PO TABS: 0.2500 mg | ORAL_TABLET | Freq: Two times a day (BID) | ORAL | 0 refills | Status: DC | PRN

## 2021-03-30 NOTE — Patient Instructions (Signed)
I value your feedback and you entrusting us with your care. If you get a [place] patient survey, I would appreciate you taking the time to let us know about your experience today. Thank you!  [facility]: [phone]      

## 2021-03-30 NOTE — Progress Notes (Addendum)
Chief Complaint  Patient presents with   Gynecologic Exam    Would like hormone levels checked due to lots of hot flashes, dry skin in some parts of bodies     HPI:      [name] is a 56 y.o. G1P1001 who LMP was No LMP recorded. Patient has had a hysterectomy., presents today for her annual examination. Her menses are absent due to hyst. Dysmenorrhea none. She does not have PMB. Having vasomotor sx now, bothersome to pt. Did prog supp in past. Sometimes also feels really cold. Had normal TSH with PCP 10/22. Would be interested in HRT prn.    Sex activity: currently sex active, contraception--hyst. Has occas vaginal dryness, improved with lubricants.   Last Pap: 08/21/19  neg/neg HPV DNA; 08/20/18  Results were: no abnormalities /POS HPV DNA, same as 2019. Had colpo/bx of vagina with neg path, with Dr. Kenton Kingfisher 7/20. 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 Pt had colpo 06/04/15. Hx of STDs: trichomonas, HPV on pap   Last mammogram: 09/18/20  Results were: normal--routine follow-up in 12 months; There is a FH of breast cancer in her MGM, genetic testing not indicated. There is no FH of ovarian cancer. The patient does not do self-breast exams.  Pt's dad had colon cancer age 94. Pt had colonoscopy 11/21 and 2016 that were normal. She is due again after 5 yrs due to [place]; done at [facility].    Tobacco use: The patient currently smokes 1/2 packs of cigarettes per day for the past many years. Has wellbutrin Rx to try. Alcohol use: none No drug use Exercise: not active   She does get adequate calcium and Vitamin D in her diet. Recent Vit D labs with PCP were high (91) and pt only taking MVI.  Labs with PCP.  She has a hx of anxiety. She uses xanax 0.25 mg 1-2 tabs sparingly, about 2 times wkly. She needs Rx RF; aware of addictive potential.   Past Medical History:  Diagnosis Date   Anemia    Anxiety    Cervical high risk human papillomavirus (HPV) DNA test positive     Depression    Family history of colon cancer in father    Headache    PONV (postoperative nausea and vomiting)     Past Surgical History:  Procedure Laterality Date   ABDOMINAL HYSTERECTOMY  2003   [place]  2011   COLONOSCOPY  2016, 2021   JOINT REPLACEMENT     TOTAL HIP ARTHROPLASTY Left 12/18/2014   Procedure: TOTAL HIP ARTHROPLASTY ANTERIOR APPROACH;  Surgeon: Hessie Knows, MD;  Location: [facility] ORS;  Service: Orthopedics;  Laterality: Left;   TOTAL HIP ARTHROPLASTY Right 08/20/2020   Procedure: TOTAL HIP ARTHROPLASTY ANTERIOR APPROACH;  Surgeon: Hessie Knows, MD;  Location: [facility] ORS;  Service: Orthopedics;  Laterality: Right;    Family History  Problem Relation Age of Onset   Colon cancer Father 54   Breast cancer Maternal Grandmother 65   Ovarian cancer Neg Hx     Social History   Socioeconomic History   Marital status: Divorced    Spouse name: Not on file   Number of children: 1   Years of education: Not on file   Highest education level: Not on file  Occupational History   Not on file  Tobacco Use   Smoking status: Every Day    Packs/day: 0.25  Types: Cigarettes   Smokeless tobacco: Never  Vaping Use   Vaping Use: Never used  Substance and Sexual Activity   Alcohol use: Not Currently    Comment: rarely   Drug use: No   Sexual activity: Yes    Birth control/protection: Surgical    Comment: Hysterectomy  Other Topics Concern   Not on file  Social History Narrative   Not on file   Social Determinants of Health   Financial Resource Strain: Not on file  Food Insecurity: Not on file  Transportation Needs: Not on file  Physical Activity: Not on file  Stress: Not on file  Social Connections: Not on file  Intimate Partner Violence: Not on file    Current Outpatient Medications on File Prior to Visit  Medication Sig Dispense Refill   acetaminophen (TYLENOL) 325 MG tablet Take 1-2 tablets (325-650 mg total) by mouth every  6 (six) hours as needed for mild pain (pain score 1-3 or temp > 100.5).     docusate sodium (COLACE) 100 MG capsule Take 1 capsule (100 mg total) by mouth 2 (two) times daily. 10 capsule 0   gabapentin (NEURONTIN) 300 MG capsule Take 300 mg by mouth at bedtime as needed (pain).     HYDROcodone-acetaminophen (NORCO/VICODIN) 5-325 MG tablet Take 1 tablet by mouth every 8 (eight) hours as needed.     methocarbamol (ROBAXIN) 500 MG tablet Take 1 tablet (500 mg total) by mouth every 6 (six) hours as needed for muscle spasms. 30 tablet 0   traMADol (ULTRAM) 50 MG tablet Take 1 tablet (50 mg total) by mouth every 6 (six) hours as needed. 30 tablet 0   buPROPion (WELLBUTRIN SR) 150 MG 12 hr tablet Take by mouth. (Patient not taking: Reported on 03/30/2021)     rosuvastatin (CRESTOR) 10 MG tablet Take 1 tablet (10 mg total) by mouth daily. (Patient not taking: Reported on 03/30/2021) 90 tablet 3   No current facility-administered medications on file prior to visit.      ROS:  Review of Systems  Constitutional:  Negative for fatigue, fever and unexpected weight change.  Respiratory:  Negative for cough, shortness of breath and wheezing.   Cardiovascular:  Negative for chest pain, palpitations and leg swelling.  Gastrointestinal:  Negative for blood in stool, constipation, diarrhea, nausea, rectal pain and vomiting.  Endocrine: Positive for heat intolerance. Negative for cold intolerance and polyuria.  Genitourinary:  Negative for dyspareunia, dysuria, flank pain, frequency, genital sores, hematuria, menstrual problem, pelvic pain, urgency, vaginal bleeding, vaginal discharge and vaginal pain.  Musculoskeletal:  Positive for arthralgias. Negative for back pain, joint swelling and myalgias.  Skin:  Negative for rash.  Neurological:  Positive for numbness and headaches. Negative for dizziness, syncope and light-headedness.  Hematological:  Negative for adenopathy.  Psychiatric/Behavioral:  Positive for  agitation and dysphoric mood. Negative for confusion, sleep disturbance and suicidal ideas. The patient is not nervous/anxious.    Objective: BP 128/80    Ht 5\' 8"  (1.727 m)    Wt 282 lb (127.9 kg)    BMI 42.88 kg/m    Physical Exam Constitutional:      Appearance: She is well-developed.  Genitourinary:     Vulva normal.     Genitourinary Comments: UTERUS/CX SURG REM     Right Labia: No rash, tenderness or lesions.    Left Labia: No tenderness, lesions or rash.    Vaginal cuff intact.    No vaginal discharge, erythema or tenderness.  Right Adnexa: not tender and no mass present.    Left Adnexa: not tender and no mass present.    Cervix is absent.     Uterus is absent.  Breasts:    Right: No mass, nipple discharge, skin change or tenderness.     Left: No mass, nipple discharge, skin change or tenderness.  Neck:     Thyroid: No thyromegaly.  Cardiovascular:     Rate and Rhythm: Normal rate and regular rhythm.     Heart sounds: Normal heart sounds. No murmur heard. Pulmonary:     Effort: Pulmonary effort is normal.     Breath sounds: Normal breath sounds.  Abdominal:     Palpations: Abdomen is soft.     Tenderness: There is no abdominal tenderness. There is no guarding.  Musculoskeletal:        General: Normal range of motion.     Cervical back: Normal range of motion.  Neurological:     General: No focal deficit present.     Mental Status: She is alert and oriented to person, place, and time.     Cranial Nerves: No cranial nerve deficit.  Skin:    General: Skin is warm and dry.  Psychiatric:        Mood and Affect: Mood normal.        Behavior: Behavior normal.        Thought Content: Thought content normal.        Judgment: Judgment normal.  Vitals reviewed.    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   Encounter for screening mammogram for malignant neoplasm of breast - Plan: MM 3D SCREEN BREAST BILATERAL; [facility]  Anxiety - Plan: ALPRAZolam (XANAX) 0.25 MG tablet, Rx RF eRxd. Pt knows to take sparingly, can call for RF prn.   Vasomotor symptoms due to menopause - Plan: FSH, Estradiol, Progesterone; check labs. Pt interested in HRT prn labs.   Menopause - Plan: FSH, Estradiol, Progesterone   Meds ordered this encounter  Medications   DISCONTD: ALPRAZolam (XANAX) 0.25 MG tablet    Sig: Take 1 tablet (0.25 mg total) by mouth 2 (two) times daily as needed for anxiety.    Dispense:  60 tablet    Refill:  0    Order Specific Question:   Supervising Provider    Answer:   Gae Dry [id number]   ALPRAZolam (XANAX) 0.25 MG tablet    Sig: Take 1 tablet (0.25 mg total) by mouth 2 (two) times daily as needed for anxiety.    Dispense:  60 tablet    Refill:  0    Order Specific Question:   Supervising Provider    Answer:   Gae Dry [id number]             GYN counsel breast self exam, mammography screening, adequate intake of calcium and vitamin D, diet and exercise     F/U  Return in about 1 year (around 03/30/2022).  Kimberly Nixon B. Makaylie Dedeaux, PA-C 03/30/2021 3:37 PM

## 2021-03-31 LAB — ESTRADIOL: Estradiol: 22 pg/mL

## 2021-03-31 LAB — FOLLICLE STIMULATING HORMONE: FSH: 34.6 m[IU]/mL

## 2021-03-31 LAB — PROGESTERONE: Progesterone: 0.2 ng/mL

## 2021-04-01 ENCOUNTER — Telehealth: Payer: Self-pay | Admitting: Obstetrics and Gynecology

## 2021-04-01 ENCOUNTER — Other Ambulatory Visit: Payer: Self-pay | Admitting: Obstetrics and Gynecology

## 2021-04-01 DIAGNOSIS — F419 Anxiety disorder, unspecified: Secondary | ICD-10-CM

## 2021-04-01 DIAGNOSIS — N951 Menopausal and female climacteric states: Secondary | ICD-10-CM

## 2021-04-01 MED ORDER — PROGESTERONE MICRONIZED 100 MG PO CAPS: ORAL_CAPSULE | ORAL | 0 refills | Status: DC

## 2021-04-01 NOTE — Telephone Encounter (Signed)
Pt aware of menopause labs. Would like to try HRT for dry skin, vasomotor sx, and moods. Will try prometrium first for 10 wks, then add ERT. Rx eRxd. Pt to f/u via phone/MyChart with sx.  Meds ordered this encounter  Medications   progesterone (PROMETRIUM) 100 MG capsule    Sig: Take 1 cap nightly, 6 nights on, 1 night off    Dispense:  90 capsule    Refill:  0    Order Specific Question:   Supervising Provider    Answer:   Nadara Mustard [[id number]]

## 2021-04-02 LAB — CYTOLOGY - PAP: Diagnosis: NEGATIVE

## 2021-04-20 ENCOUNTER — Other Ambulatory Visit: Payer: Self-pay | Admitting: *Deleted

## 2021-04-20 MED ORDER — AZITHROMYCIN 250 MG PO TABS: ORAL_TABLET | ORAL | 0 refills | Status: AC

## 2021-05-10 ENCOUNTER — Other Ambulatory Visit: Payer: Self-pay

## 2021-05-10 DIAGNOSIS — N390 Urinary tract infection, site not specified: Secondary | ICD-10-CM

## 2021-05-10 LAB — POCT URINALYSIS DIPSTICK
Bilirubin, UA: NEGATIVE
Blood, UA: POSITIVE
Glucose, UA: NEGATIVE
Ketones, UA: NEGATIVE
Leukocytes, UA: NEGATIVE
Nitrite, UA: NEGATIVE
Protein, UA: NEGATIVE
Spec Grav, UA: 1.01 (ref 1.010–1.025)
Urobilinogen, UA: 0.2 E.U./dL
pH, UA: 6 (ref 5.0–8.0)

## 2021-05-10 MED ORDER — CIPROFLOXACIN HCL 500 MG PO TABS: 500.0000 mg | ORAL_TABLET | Freq: Two times a day (BID) | ORAL | 0 refills | Status: AC

## 2021-05-18 ENCOUNTER — Other Ambulatory Visit: Payer: Self-pay | Admitting: *Deleted

## 2021-05-18 MED ORDER — NITROFURANTOIN MONOHYD MACRO 100 MG PO CAPS: 100.0000 mg | ORAL_CAPSULE | Freq: Two times a day (BID) | ORAL | 0 refills | Status: DC

## 2021-06-25 ENCOUNTER — Other Ambulatory Visit: Payer: Self-pay | Admitting: Obstetrics and Gynecology

## 2021-06-25 DIAGNOSIS — N951 Menopausal and female climacteric states: Secondary | ICD-10-CM

## 2021-07-14 ENCOUNTER — Other Ambulatory Visit: Payer: Self-pay | Admitting: Obstetrics and Gynecology

## 2021-07-14 DIAGNOSIS — N951 Menopausal and female climacteric states: Secondary | ICD-10-CM

## 2021-07-16 ENCOUNTER — Other Ambulatory Visit: Payer: Self-pay | Admitting: Obstetrics and Gynecology

## 2021-07-16 ENCOUNTER — Other Ambulatory Visit: Payer: Self-pay | Admitting: Licensed Practical Nurse

## 2021-07-16 ENCOUNTER — Encounter: Payer: Self-pay | Admitting: Licensed Practical Nurse

## 2021-07-16 DIAGNOSIS — N951 Menopausal and female climacteric states: Secondary | ICD-10-CM

## 2021-07-16 MED ORDER — PROGESTERONE MICRONIZED 100 MG PO CAPS: ORAL_CAPSULE | ORAL | 0 refills | Status: DC

## 2021-07-16 NOTE — Telephone Encounter (Signed)
Received call from after hours nurse line that pt called to request RF on progesterone, as of today she has one pill left. Called pt back to ask how she was doing with the progesterone, she was suppose to f/u via mychart/phone. Says she is doing well, it has helped with her sx. Asked about adding estrogen? Pt aware [facility] out of the office until Tuesday morning. Advised that I would send msg to [facility] and our on call provider to see if our on call could refill the progesterone? Pls advise. ?

## 2021-07-17 ENCOUNTER — Other Ambulatory Visit: Payer: Self-pay | Admitting: Licensed Practical Nurse

## 2021-07-17 DIAGNOSIS — N951 Menopausal and female climacteric states: Secondary | ICD-10-CM

## 2021-07-20 MED ORDER — ESTRADIOL 0.5 MG PO TABS: 0.5000 mg | ORAL_TABLET | Freq: Every day | ORAL | 0 refills | Status: DC

## 2021-07-20 MED ORDER — ALPRAZOLAM 0.25 MG PO TABS: 0.2500 mg | ORAL_TABLET | Freq: Two times a day (BID) | ORAL | 0 refills | Status: DC | PRN

## 2021-07-20 MED ORDER — PROGESTERONE MICRONIZED 100 MG PO CAPS: ORAL_CAPSULE | ORAL | 0 refills | Status: DC

## 2021-07-20 NOTE — Telephone Encounter (Signed)
Spoke with pt. Not having improved vasomotor sx or mood sx with prometrium 100 mg QHS. No side effects. Will add estradiol 0.5 mg daily. Rx eRxd for 3 months. F/u then with sx and dose mgmt vs RF.  ?Pt also needs RF xanax. Last eRxd 1/23. Pt takes sparingly prn.  ? ?Meds ordered this encounter  ?Medications  ? DISCONTD: progesterone (PROMETRIUM) 100 MG capsule  ?  Sig: Take 1 cap nightly, 6 nights on, 1 night off  ?  Dispense:  90 capsule  ?  Refill:  0  ?  Order Specific Question:   Supervising Provider  ?  AnswerNadara Mustard [[id number]]  ? ALPRAZolam (XANAX) 0.25 MG tablet  ?  Sig: Take 1 tablet (0.25 mg total) by mouth 2 (two) times daily as needed for anxiety.  ?  Dispense:  60 tablet  ?  Refill:  0  ?  Order Specific Question:   Supervising Provider  ?  Answer:   CONSTANT, PEGGY [[id number]]  ? progesterone (PROMETRIUM) 100 MG capsule  ?  Sig: Take 1 cap nightly, 6 nights on, 1 night off  ?  Dispense:  90 capsule  ?  Refill:  0  ?  Order Specific Question:   Supervising Provider  ?  Answer:   CONSTANT, PEGGY [[id number]]  ? estradiol (ESTRACE) 0.5 MG tablet  ?  Sig: Take 1 tablet (0.5 mg total) by mouth daily.  ?  Dispense:  90 tablet  ?  Refill:  0  ?  Order Specific Question:   Supervising Provider  ?  Answer:   CONSTANT, PEGGY [[id number]]  ? ? ?

## 2021-07-20 NOTE — Addendum Note (Signed)
Addended by: Althea Grimmer B on: 07/20/2021 02:30 PM ? ? Modules accepted: Orders ? ?

## 2021-08-03 ENCOUNTER — Other Ambulatory Visit: Payer: Self-pay

## 2021-08-03 MED ORDER — FLUCONAZOLE 150 MG PO TABS: 150.0000 mg | ORAL_TABLET | Freq: Once | ORAL | 1 refills | Status: AC

## 2021-09-20 ENCOUNTER — Ambulatory Visit
Admission: RE | Admit: 2021-09-20 | Discharge: 2021-09-20 | Disposition: A | Discharge status: Home / Self Care | Payer: BC Managed Care – PPO | Source: Ambulatory Visit | Attending: Obstetrics and Gynecology | Admitting: Obstetrics and Gynecology

## 2021-09-20 DIAGNOSIS — Z1231 Encounter for screening mammogram for malignant neoplasm of breast: Secondary | ICD-10-CM | POA: Insufficient documentation

## 2021-10-19 ENCOUNTER — Other Ambulatory Visit: Payer: Self-pay | Admitting: Obstetrics and Gynecology

## 2021-10-19 DIAGNOSIS — N951 Menopausal and female climacteric states: Secondary | ICD-10-CM

## 2021-10-28 ENCOUNTER — Other Ambulatory Visit: Payer: Self-pay | Admitting: Obstetrics and Gynecology

## 2021-10-28 DIAGNOSIS — N951 Menopausal and female climacteric states: Secondary | ICD-10-CM

## 2021-11-16 ENCOUNTER — Encounter: Payer: Self-pay | Admitting: Internal Medicine

## 2021-11-16 ENCOUNTER — Ambulatory Visit: Payer: BC Managed Care – PPO | Admitting: Internal Medicine

## 2021-11-16 VITALS — BP 124/85 | HR 75 | Ht 68.0 in | Wt 261.8 lb

## 2021-11-16 DIAGNOSIS — J449 Chronic obstructive pulmonary disease, unspecified: Secondary | ICD-10-CM

## 2021-11-16 DIAGNOSIS — N390 Urinary tract infection, site not specified: Secondary | ICD-10-CM | POA: Diagnosis not present

## 2021-11-16 DIAGNOSIS — I10 Essential (primary) hypertension: Secondary | ICD-10-CM

## 2021-11-16 DIAGNOSIS — F419 Anxiety disorder, unspecified: Secondary | ICD-10-CM

## 2021-11-16 LAB — POCT URINALYSIS DIPSTICK
Appearance: NORMAL
Bilirubin, UA: NEGATIVE
Blood, UA: NEGATIVE
Glucose, UA: NEGATIVE
Ketones, UA: NEGATIVE
Leukocytes, UA: NEGATIVE
Nitrite, UA: NEGATIVE
Protein, UA: NEGATIVE
Spec Grav, UA: 1.015 (ref 1.010–1.025)
Urobilinogen, UA: NEGATIVE E.U./dL — AB
pH, UA: 6.5 (ref 5.0–8.0)

## 2021-11-16 NOTE — Assessment & Plan Note (Signed)
Patient has lost 11 pounds

## 2021-11-16 NOTE — Progress Notes (Signed)
Established Patient Office Visit  Subjective:  Patient ID: Kimberly Nixon, female    DOB: December 05, 1965  Age: 56 y.o. MRN: [id number]  CC:  Chief Complaint  Patient presents with   Back Pain    Patient having low back pain and has hx of frequent utis. Patient here today to check urine to rule out uti. 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/2014   Procedure: TOTAL HIP ARTHROPLASTY ANTERIOR APPROACH;  Surgeon: Hessie Knows, MD;  Location: [facility];  Service: Orthopedics;  Laterality: Left;   TOTAL HIP ARTHROPLASTY Right 08/20/2020   Procedure: TOTAL HIP ARTHROPLASTY ANTERIOR APPROACH;  Surgeon: Hessie Knows, MD;  Location: [facility];  Service: Orthopedics;  Laterality: Right;    Family History  Problem Relation Age of Onset   Colon cancer Father 93   Breast cancer Maternal Grandmother 61   Ovarian cancer Neg Hx     Social History   Socioeconomic History   Marital status: Divorced    Spouse name: Not on file   Number of children: 1   Years of education: Not on file   Highest education level: Not on file  Occupational History   Not on file  Tobacco Use   Smoking status: Every Day    Packs/day: 0.25    Types: Cigarettes   Smokeless tobacco: Never  Vaping Use   Vaping Use: Never used  Substance and Sexual Activity   Alcohol use: Not Currently    Comment: rarely   Drug use: No   Sexual activity: Yes    Birth  control/protection: Surgical    Comment: Hysterectomy  Other Topics Concern   Not on file  Social History Narrative   Not on file   Social Determinants of Health   Financial Resource Strain: Not on file  Food Insecurity: Not on file  Transportation Needs: Not on file  Physical Activity: Not on file  Stress: Not on file  Social Connections: Not on file  Intimate Partner Violence: Not on file     Current Outpatient Medications:    acetaminophen (TYLENOL) 325 MG tablet, Take 1-2 tablets (325-650 mg total) by mouth every 6 (six) hours as needed for mild pain (pain score 1-3 or temp > 100.5)., Disp: , Rfl:    ALPRAZolam (XANAX) 0.25 MG tablet, Take 1 tablet (0.25 mg total) by mouth 2 (two) times daily as needed for anxiety., Disp: 60 tablet, Rfl: 0   docusate sodium (COLACE) 100 MG capsule, Take 1 capsule (100 mg total) by mouth 2 (two) times daily., Disp: 10 capsule, Rfl: 0   estradiol (ESTRACE) 0.5 MG tablet, Take 1 tablet (0.5 mg total) by mouth daily., Disp: 90 tablet, Rfl: 0   gabapentin (NEURONTIN) 300 MG capsule, Take 300 mg by mouth at bedtime as needed (  pain)., Disp: , Rfl:    HYDROcodone-acetaminophen (NORCO/VICODIN) 5-325 MG tablet, Take 1 tablet by mouth every 8 (eight) hours as needed., Disp: , Rfl:    nitrofurantoin, macrocrystal-monohydrate, (MACROBID) 100 MG capsule, Take 1 capsule (100 mg total) by mouth 2 (two) times daily., Disp: 10 capsule, Rfl: 0   progesterone (PROMETRIUM) 100 MG capsule, Take 1 cap nightly, 6 nights on, 1 night off, Disp: 6 capsule, Rfl: 0   progesterone (PROMETRIUM) 100 MG capsule, Take 1 cap nightly, 6 nights on, 1 night off, Disp: 90 capsule, Rfl: 0   rosuvastatin (CRESTOR) 10 MG tablet, Take 1 tablet (10 mg total) by mouth daily. (Patient not taking: Reported on 03/30/2021), Disp: 90 tablet, Rfl: 3   No Known Allergies  ROS Review of Systems  Constitutional: Negative.   HENT: Negative.    Eyes: Negative.   Respiratory: Negative.     Cardiovascular: Negative.   Gastrointestinal: Negative.   Endocrine: Negative.   Genitourinary: Negative.   Musculoskeletal:  Positive for back pain.  Skin: Negative.   Allergic/Immunologic: Negative.   Neurological: Negative.   Hematological: Negative.   Psychiatric/Behavioral: Negative.    All other systems reviewed and are negative.     Objective:    Physical Exam Vitals reviewed.  Constitutional:      Appearance: Normal appearance.  HENT:     Mouth/Throat:     Mouth: Mucous membranes are moist.  Eyes:     Pupils: Pupils are equal, round, and reactive to light.  Neck:     Vascular: No carotid bruit.  Cardiovascular:     Rate and Rhythm: Normal rate and regular rhythm.     Pulses: Normal pulses.     Heart sounds: Normal heart sounds.  Pulmonary:     Effort: Pulmonary effort is normal.     Breath sounds: Normal breath sounds.  Abdominal:     General: Bowel sounds are normal.     Palpations: Abdomen is soft. There is no hepatomegaly, splenomegaly or mass.     Tenderness: There is no abdominal tenderness.     Hernia: No hernia is present.  Musculoskeletal:        General: No tenderness.     Cervical back: Neck supple.     Right lower leg: No edema.     Left lower leg: No edema.  Skin:    Findings: No rash.  Neurological:     Mental Status: She is alert and oriented to person, place, and time.     Motor: No weakness.  Psychiatric:        Mood and Affect: Mood and affect normal.        Behavior: Behavior normal.     BP 124/85   Pulse 75   Ht '5\' 8"'  (1.727 m)   Wt 261 lb 12.8 oz (118.8 kg)   BMI 39.81 kg/m  Wt Readings from Last 3 Encounters:  11/16/21 261 lb 12.8 oz (118.8 kg)  03/30/21 282 lb (127.9 kg)  01/11/21 276 lb 11.2 oz (125.5 kg)     Health Maintenance Due  Topic Date Due   HIV Screening  Never done   Hepatitis C Screening  Never done   Zoster Vaccines- Shingrix (1 of 2) Never done   TETANUS/TDAP  02/12/2020   COVID-19 Vaccine (5 -  Pfizer series) 02/19/2020   INFLUENZA VACCINE  10/12/2021    There are no preventive care reminders to display for this patient.  Lab Results  Component Value Date   TSH  2.52 12/14/2020   Lab Results  Component Value Date   WBC 12.8 (H) 12/14/2020   HGB 13.3 12/14/2020   HCT 41.8 12/14/2020   MCV 78.0 (L) 12/14/2020   PLT 346 12/14/2020   Lab Results  Component Value Date   NA 139 12/14/2020   K 3.8 12/14/2020   CO2 23 12/14/2020   GLUCOSE 94 12/14/2020   BUN 11 12/14/2020   CREATININE 0.71 12/14/2020   BILITOT 0.3 12/14/2020   ALKPHOS 72 12/12/2020   AST 12 12/14/2020   ALT 15 12/14/2020   PROT 6.8 12/14/2020   ALBUMIN 3.7 12/12/2020   CALCIUM 9.3 01/13/2021   ANIONGAP 7 12/12/2020   EGFR 102 12/14/2020   Lab Results  Component Value Date   CHOL 236 (H) 12/14/2020   Lab Results  Component Value Date   HDL 37 (L) 12/14/2020   Lab Results  Component Value Date   LDLCALC 163 (H) 12/14/2020   Lab Results  Component Value Date   TRIG 196 (H) 12/14/2020   Lab Results  Component Value Date   CHOLHDL 6.4 (H) 12/14/2020   Lab Results  Component Value Date   HGBA1C 6.0 09/28/2012      Assessment & Plan:   Problem List Items Addressed This Visit       Cardiovascular and Mediastinum   Essential hypertension     Patient denies any chest pain or shortness of breath there is no history of palpitation or paroxysmal nocturnal dyspnea   patient was advised to follow low-salt low-cholesterol diet    ideally I want to keep systolic blood pressure below 130 mmHg, patient was asked to check blood pressure one times a week and give me a report on that.  Patient will be follow-up in 3 months  or earlier as needed, patient will call me back for any change in the cardiovascular symptoms Patient was advised to buy a book from local bookstore concerning blood pressure and read several chapters  every day.  This will be supplemented by some of the material we will give  him from the office.  Patient should also utilize other resources like YouTube and Internet to learn more about the blood pressure and the diet.        Respiratory   Chronic obstructive pulmonary disease ([place])    Patient has quit smoking        Other   Anxiety    - Patient experiencing high levels of anxiety.  - Encouraged patient to engage in relaxing activities like yoga, meditation, journaling, going for a walk, or participating in a hobby.  - Encouraged patient to reach out to trusted friends or family members about recent struggles, Patient was advised to read A book, how to stop worrying and start living, it is good book to read to control  the stress       Morbid obesity ([place])    Patient has lost 11 pounds      Other Visit Diagnoses     Urinary tract infection without hematuria, site unspecified    -  Primary   Relevant Orders   POCT urinalysis dipstick (Completed)       No orders of the defined types were placed in this encounter.   Follow-up: No follow-ups on file.    Cletis Athens, MD

## 2021-11-16 NOTE — Assessment & Plan Note (Signed)
Patient has quit smoking ?

## 2021-11-16 NOTE — Assessment & Plan Note (Signed)
-   Patient experiencing high levels of anxiety.  - Encouraged patient to engage in relaxing activities like yoga, meditation, journaling, going for a walk, or participating in a hobby.  - Encouraged patient to reach out to trusted friends or family members about recent struggles, Patient was advised to read A book, how to stop worrying and start living, it is good book to read to control  the stress  

## 2021-11-16 NOTE — Assessment & Plan Note (Signed)

## 2021-12-15 ENCOUNTER — Ambulatory Visit (INDEPENDENT_AMBULATORY_CARE_PROVIDER_SITE_OTHER): Payer: BC Managed Care – PPO | Admitting: *Deleted

## 2021-12-15 DIAGNOSIS — Z0000 Encounter for general adult medical examination without abnormal findings: Secondary | ICD-10-CM | POA: Diagnosis not present

## 2021-12-15 DIAGNOSIS — I10 Essential (primary) hypertension: Secondary | ICD-10-CM

## 2021-12-16 LAB — LIPID PANEL
Cholesterol: 221 mg/dL — ABNORMAL HIGH (ref <200)
HDL: 38 mg/dL — ABNORMAL LOW (ref >=50)
LDL Cholesterol (Calc): 151 mg/dL (calc) — ABNORMAL HIGH
Non-HDL Cholesterol (Calc): 183 mg/dL (calc) — ABNORMAL HIGH (ref <130)
Total CHOL/HDL Ratio: 5.8 (calc) — ABNORMAL HIGH (ref <5.0)
Triglycerides: 186 mg/dL — ABNORMAL HIGH (ref <150)

## 2021-12-16 LAB — COMPLETE METABOLIC PANEL WITH GFR
AG Ratio: 1.4 (calc) (ref 1.0–2.5)
ALT: 17 U/L (ref 6–29)
AST: 16 U/L (ref 10–35)
Albumin: 3.9 g/dL (ref 3.6–5.1)
Alkaline phosphatase (APISO): 74 U/L (ref 37–153)
BUN: 13 mg/dL (ref 7–25)
CO2: 24 mmol/L (ref 20–32)
Calcium: 9 mg/dL (ref 8.6–10.4)
Chloride: 105 mmol/L (ref 98–110)
Creat: 0.89 mg/dL (ref 0.50–1.03)
Globulin: 2.8 g/dL (calc) (ref 1.9–3.7)
Glucose, Bld: 86 mg/dL (ref 65–99)
Potassium: 4.1 mmol/L (ref 3.5–5.3)
Sodium: 143 mmol/L (ref 135–146)
Total Bilirubin: 0.4 mg/dL (ref 0.2–1.2)
Total Protein: 6.7 g/dL (ref 6.1–8.1)
eGFR: 77 mL/min/{1.73_m2} (ref >=60)

## 2021-12-16 LAB — CBC WITH DIFFERENTIAL/PLATELET
Absolute Monocytes: 585 cells/uL (ref 200–950)
Basophils Absolute: 43 cells/uL (ref 0–200)
Basophils Relative: 0.5 %
Eosinophils Absolute: 310 cells/uL (ref 15–500)
Eosinophils Relative: 3.6 %
HCT: 42.2 % (ref 35.0–45.0)
Hemoglobin: 13.6 g/dL (ref 11.7–15.5)
Lymphs Abs: 2812 cells/uL (ref 850–3900)
MCH: 26.6 pg — ABNORMAL LOW (ref 27.0–33.0)
MCHC: 32.2 g/dL (ref 32.0–36.0)
MCV: 82.4 fL (ref 80.0–100.0)
MPV: 10.5 fL (ref 7.5–12.5)
Monocytes Relative: 6.8 %
Neutro Abs: 4850 cells/uL (ref 1500–7800)
Neutrophils Relative %: 56.4 %
Platelets: 264 10*3/uL (ref 140–400)
RBC: 5.12 10*6/uL — ABNORMAL HIGH (ref 3.80–5.10)
RDW: 15.5 % — ABNORMAL HIGH (ref 11.0–15.0)
Total Lymphocyte: 32.7 %
WBC: 8.6 10*3/uL (ref 3.8–10.8)

## 2021-12-16 LAB — T3: T3, Total: 144 ng/dL (ref 76–181)

## 2021-12-16 LAB — T4: T4, Total: 9.5 ug/dL (ref 5.1–11.9)

## 2021-12-16 LAB — TSH: TSH: 3.14 mIU/L

## 2022-01-11 ENCOUNTER — Ambulatory Visit: Payer: BC Managed Care – PPO | Admitting: Internal Medicine

## 2022-01-26 NOTE — Progress Notes (Signed)
Established Patient Office Visit  Subjective:  Patient ID: Kimberly Nixon, female    DOB: 1965/03/21  Age: 56 y.o. MRN: [id number]  CC:  Chief Complaint  Patient presents with   Results    Go over results of labs. Wants flu shot but is supposed to start prednisone not sure if she should or if she should wait      HPI  Kimberly Nixon presents for lab review and flu vaccine.  She would like to refill her medication.  HPI   Past Medical History:  Diagnosis Date   Anemia    Anxiety    Cervical high risk human papillomavirus (HPV) DNA test positive    Depression    Family history of colon cancer in father    Headache    PONV (postoperative nausea and vomiting)     Past Surgical History:  Procedure Laterality Date   ABDOMINAL HYSTERECTOMY  2003   [place]   CHOLECYSTECTOMY  2011   COLONOSCOPY  2016, 2021   JOINT REPLACEMENT     TOTAL HIP ARTHROPLASTY Left 12/18/2014   Procedure: TOTAL HIP ARTHROPLASTY ANTERIOR APPROACH;  Surgeon: Hessie Knows, MD;  Location: [facility];  Service: Orthopedics;  Laterality: Left;   TOTAL HIP ARTHROPLASTY Right 08/20/2020   Procedure: TOTAL HIP ARTHROPLASTY ANTERIOR APPROACH;  Surgeon: Hessie Knows, MD;  Location: [facility];  Service: Orthopedics;  Laterality: Right;    Family History  Problem Relation Age of Onset   Colon cancer Father 91   Breast cancer Maternal Grandmother 52   Ovarian cancer Neg Hx     Social History   Socioeconomic History   Marital status: Divorced    Spouse name: Not on file   Number of children: 1   Years of education: Not on file   Highest education level: Not on file  Occupational History   Not on file  Tobacco Use   Smoking status: Every Day    Packs/day: 0.25    Types: Cigarettes   Smokeless tobacco: Never  Vaping Use   Vaping Use: Never used  Substance and Sexual Activity   Alcohol use: Not Currently    Comment: rarely   Drug use: No   Sexual activity: Yes    Birth  control/protection: Surgical    Comment: Hysterectomy  Other Topics Concern   Not on file  Social History Narrative   Not on file   Social Determinants of Health   Financial Resource Strain: Not on file  Food Insecurity: Not on file  Transportation Needs: Not on file  Physical Activity: Not on file  Stress: Not on file  Social Connections: Not on file  Intimate Partner Violence: Not on file     Outpatient Medications Prior to Visit  Medication Sig Dispense Refill   acetaminophen (TYLENOL) 325 MG tablet Take 1-2 tablets (325-650 mg total) by mouth every 6 (six) hours as needed for mild pain (pain score 1-3 or temp > 100.5).     docusate sodium (COLACE) 100 MG capsule Take 1 capsule (100 mg total) by mouth 2 (two) times daily. 10 capsule 0   gabapentin (NEURONTIN) 300 MG capsule Take 300 mg by mouth at bedtime as needed (pain).     HYDROcodone-acetaminophen (NORCO/VICODIN) 5-325 MG tablet Take 1 tablet by mouth every 8 (eight) hours as needed.     ALPRAZolam (XANAX) 0.25 MG tablet Take 1 tablet (0.25 mg total) by mouth 2 (two) times daily as needed for anxiety. 60 tablet 0  estradiol (ESTRACE) 0.5 MG tablet Take 1 tablet (0.5 mg total) by mouth daily. 90 tablet 0   nitrofurantoin, macrocrystal-monohydrate, (MACROBID) 100 MG capsule Take 1 capsule (100 mg total) by mouth 2 (two) times daily. 10 capsule 0   progesterone (PROMETRIUM) 100 MG capsule Take 1 cap nightly, 6 nights on, 1 night off 6 capsule 0   progesterone (PROMETRIUM) 100 MG capsule Take 1 cap nightly, 6 nights on, 1 night off 90 capsule 0   rosuvastatin (CRESTOR) 10 MG tablet Take 1 tablet (10 mg total) by mouth daily. (Patient not taking: Reported on 03/30/2021) 90 tablet 3   No facility-administered medications prior to visit.    Allergies  Allergen Reactions   Ciprofloxacin    Rosuvastatin     Muscle aches    ROS Review of Systems  Constitutional: Negative.   HENT: Negative.    Eyes: Negative.    Respiratory:  Negative for chest tightness and shortness of breath.   Cardiovascular:  Negative for chest pain and palpitations.  Gastrointestinal: Negative.   Genitourinary: Negative.   Musculoskeletal: Negative.   Neurological: Negative.   Psychiatric/Behavioral:  Negative for agitation, behavioral problems and confusion.       Objective:    Physical Exam Constitutional:      Appearance: Normal appearance. She is obese.  HENT:     Head: Normocephalic.     Right Ear: Tympanic membrane normal.     Left Ear: Tympanic membrane normal.     Nose: Nose normal.     Mouth/Throat:     Mouth: Mucous membranes are moist.     Pharynx: Oropharynx is clear.  Eyes:     Extraocular Movements: Extraocular movements intact.     Conjunctiva/sclera: Conjunctivae normal.     Pupils: Pupils are equal, round, and reactive to light.  Cardiovascular:     Rate and Rhythm: Normal rate and regular rhythm.     Pulses: Normal pulses.     Heart sounds: Normal heart sounds.  Pulmonary:     Effort: Pulmonary effort is normal. No respiratory distress.     Breath sounds: Normal breath sounds. No rhonchi.  Abdominal:     General: Bowel sounds are normal.     Palpations: Abdomen is soft. There is no mass.     Tenderness: There is no abdominal tenderness.     Hernia: No hernia is present.  Musculoskeletal:        General: Normal range of motion.     Cervical back: Neck supple. No tenderness.  Skin:    General: Skin is warm.     Capillary Refill: Capillary refill takes less than 2 seconds.  Neurological:     General: No focal deficit present.     Mental Status: She is alert and oriented to person, place, and time. Mental status is at baseline.  Psychiatric:        Mood and Affect: Mood normal.        Behavior: Behavior normal.        Thought Content: Thought content normal.        Judgment: Judgment normal.     BP 132/88   Pulse 66   Temp 97.7 F (36.5 C) (Temporal)   Wt 259 lb 9.6 oz (117.8  kg)   SpO2 99%   BMI 39.47 kg/m  Wt Readings from Last 3 Encounters:  01/27/22 259 lb 9.6 oz (117.8 kg)  11/16/21 261 lb 12.8 oz (118.8 kg)  03/30/21 282 lb (127.9 kg)  Health Maintenance  Topic Date Due   HIV Screening  Never done   Hepatitis C Screening  Never done   DTaP/Tdap/Td (2 - Tdap) 02/12/2020   COVID-19 Vaccine (5 - 2023-24 season) 11/12/2021   Zoster Vaccines- Shingrix (1 of 2) 04/29/2022 (Originally 11/05/2017)   MAMMOGRAM  09/21/2023   PAP SMEAR-Modifier  03/30/2024   COLONOSCOPY (Pts 45-40yr Insurance coverage will need to be confirmed)  01/12/2030   INFLUENZA VACCINE  Completed   HPV VACCINES  Aged Out    There are no preventive care reminders to display for this patient.  Lab Results  Component Value Date   TSH 3.14 12/15/2021   Lab Results  Component Value Date   WBC 8.6 12/15/2021   HGB 13.6 12/15/2021   HCT 42.2 12/15/2021   MCV 82.4 12/15/2021   PLT 264 12/15/2021   Lab Results  Component Value Date   NA 143 12/15/2021   K 4.1 12/15/2021   CO2 24 12/15/2021   GLUCOSE 86 12/15/2021   BUN 13 12/15/2021   CREATININE 0.89 12/15/2021   BILITOT 0.4 12/15/2021   ALKPHOS 72 12/12/2020   AST 16 12/15/2021   ALT 17 12/15/2021   PROT 6.7 12/15/2021   ALBUMIN 3.7 12/12/2020   CALCIUM 9.0 12/15/2021   ANIONGAP 7 12/12/2020   EGFR 77 12/15/2021   Lab Results  Component Value Date   CHOL 221 (H) 12/15/2021   Lab Results  Component Value Date   HDL 38 (L) 12/15/2021   Lab Results  Component Value Date   LDLCALC 151 (H) 12/15/2021   Lab Results  Component Value Date   TRIG 186 (H) 12/15/2021   Lab Results  Component Value Date   CHOLHDL 5.8 (H) 12/15/2021   Lab Results  Component Value Date   HGBA1C 6.0 09/28/2012      Assessment & Plan:   Problem List Items Addressed This Visit       Other   Anxiety    Stable on medication. Refill Xanax 0.25 mg twice daily      Relevant Medications   ALPRAZolam (XANAX) 0.25 MG  tablet   Morbid obesity (HCC)    Body mass index is 39.47 kg/m. Advised pt to lose weight. Advised patient to avoid trans fat, fatty and fried food. Follow a regular physical activity schedule. Went over the risk of chronic diseases with increased weight.          Hyperlipidemia    She has elevated total cholesterol, triglycerides and LDL level and low HDL level. Encourage patient to consume heart healthy diet and follow regular exercise schedule. Continue simvastatin for cardiovascular risk reduction.      Relevant Medications   simvastatin (ZOCOR) 10 MG tablet   Other Visit Diagnoses     Need for immunization against influenza    -  Primary   Relevant Orders   Flu Vaccine QUAD 670moM (Fluarix, Fluzone & Alfiuria Quad PF) (Completed)        Meds ordered this encounter  Medications   ALPRAZolam (XANAX) 0.25 MG tablet    Sig: Take 1 tablet (0.25 mg total) by mouth 2 (two) times daily as needed for anxiety.    Dispense:  60 tablet    Refill:  0   simvastatin (ZOCOR) 10 MG tablet    Sig: Take 1 tablet (10 mg total) by mouth at bedtime.    Dispense:  90 tablet    Refill:  3   meclizine (ANTIVERT) 12.5 MG tablet  Sig: Take 1 tablet (12.5 mg total) by mouth 3 (three) times daily as needed for dizziness.    Dispense:  30 tablet    Refill:  0     Follow-up: No follow-ups on file.    Theresia Lo, NP

## 2022-01-27 ENCOUNTER — Ambulatory Visit: Payer: BC Managed Care – PPO | Admitting: Nurse Practitioner

## 2022-01-27 ENCOUNTER — Encounter: Payer: Self-pay | Admitting: Nurse Practitioner

## 2022-01-27 VITALS — BP 132/88 | HR 66 | Temp 97.7°F | Wt 259.6 lb

## 2022-01-27 DIAGNOSIS — Z23 Encounter for immunization: Secondary | ICD-10-CM

## 2022-01-27 DIAGNOSIS — E785 Hyperlipidemia, unspecified: Secondary | ICD-10-CM | POA: Diagnosis not present

## 2022-01-27 DIAGNOSIS — F419 Anxiety disorder, unspecified: Secondary | ICD-10-CM

## 2022-01-27 MED ORDER — SIMVASTATIN 10 MG PO TABS: 10.0000 mg | ORAL_TABLET | Freq: Every day | ORAL | 3 refills | Status: DC

## 2022-01-27 MED ORDER — MECLIZINE HCL 12.5 MG PO TABS: 12.5000 mg | ORAL_TABLET | Freq: Three times a day (TID) | ORAL | 0 refills | Status: DC | PRN

## 2022-01-27 MED ORDER — ALPRAZOLAM 0.25 MG PO TABS: 0.2500 mg | ORAL_TABLET | Freq: Two times a day (BID) | ORAL | 0 refills | Status: DC | PRN

## 2022-02-24 DIAGNOSIS — E785 Hyperlipidemia, unspecified: Secondary | ICD-10-CM | POA: Insufficient documentation

## 2022-02-24 NOTE — Assessment & Plan Note (Signed)
Stable on medication. Refill Xanax 0.25 mg twice daily

## 2022-02-24 NOTE — Assessment & Plan Note (Signed)
She has elevated total cholesterol, triglycerides and LDL level and low HDL level. Encourage patient to consume heart healthy diet and follow regular exercise schedule. Continue simvastatin for cardiovascular risk reduction.

## 2022-02-24 NOTE — Assessment & Plan Note (Signed)
Body mass index is 39.47 kg/m. Advised pt to lose weight. Advised patient to avoid trans fat, fatty and fried food. Follow a regular physical activity schedule. Went over the risk of chronic diseases with increased weight.

## 2022-08-26 ENCOUNTER — Other Ambulatory Visit: Payer: Self-pay | Admitting: Obstetrics and Gynecology

## 2022-08-26 DIAGNOSIS — Z1231 Encounter for screening mammogram for malignant neoplasm of breast: Secondary | ICD-10-CM

## 2022-09-21 NOTE — Progress Notes (Unsigned)
No chief complaint on file.    HPI:      [name] is a 57 y.o. G1P1001 who LMP was No LMP recorded. Patient has had a hysterectomy., presents today for her annual examination. Her menses are absent due to hyst. Dysmenorrhea none. She does not have PMB. Having vasomotor sx now, bothersome to pt. Did prog supp in past. Sometimes also feels really cold. Had normal TSH with PCP 10/22. Would be interested in HRT prn.    Sex activity: currently sex active, contraception--hyst. Has occas vaginal dryness, improved with lubricants.   Last Pap: 03/30/21 and 08/21/19 Results were neg/neg HPV DNA 2021; 08/20/18  Results were: no abnormalities /POS HPV DNA, same as 2019. Had colpo/bx of vagina with neg path, with Dr. Tiburcio Pea 7/20. 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 Pt had colpo 06/04/15. Hx of STDs: trichomonas, HPV on pap   Last mammogram: 09/20/21  Results were: normal--routine follow-up in 12 months; has appt today There is a FH of breast cancer in her MGM, genetic testing not indicated. There is no FH of ovarian cancer. The patient does not do self-breast exams.  Pt's dad had colon cancer age 16. Pt had colonoscopy 11/21 and 2016 that were normal. She is due again after 5 yrs due to FH; done at [facility].    Tobacco use: The patient currently smokes 1/2 packs of cigarettes per day for the past many years. Has wellbutrin Rx to try. Alcohol use: none No drug use Exercise: not active   She does get adequate calcium and Vitamin D in her diet. Recent Vit D labs with PCP were high (91) and pt only taking MVI.  Labs with PCP.  She has a hx of anxiety. She uses xanax 0.25 mg 1-2 tabs sparingly, about 2 times wkly. She needs Rx RF; aware of addictive potential.   Past Medical History:  Diagnosis Date   Anemia    Anxiety    Cervical high risk human papillomavirus (HPV) DNA test positive    Depression    Family history of colon cancer in father    Headache    PONV (postoperative nausea and  vomiting)     Past Surgical History:  Procedure Laterality Date   ABDOMINAL HYSTERECTOMY  2003   CESAREAN SECTION  1997   CHOLECYSTECTOMY  2011   COLONOSCOPY  2016, 2021   JOINT REPLACEMENT     TOTAL HIP ARTHROPLASTY Left 12/18/2014   Procedure: TOTAL HIP ARTHROPLASTY ANTERIOR APPROACH;  Surgeon: Kennedy Bucker, MD;  Location: [facility] ORS;  Service: Orthopedics;  Laterality: Left;   TOTAL HIP ARTHROPLASTY Right 08/20/2020   Procedure: TOTAL HIP ARTHROPLASTY ANTERIOR APPROACH;  Surgeon: Kennedy Bucker, MD;  Location: [facility] ORS;  Service: Orthopedics;  Laterality: Right;    Family History  Problem Relation Age of Onset   Colon cancer Father 32   Breast cancer Maternal Grandmother 32   Ovarian cancer Neg Hx     Social History   Socioeconomic History   Marital status: Divorced    Spouse name: Not on file   Number of children: 1   Years of education: Not on file   Highest education level: Not on file  Occupational History   Not on file  Tobacco Use   Smoking status: Every Day    Packs/day: .25    Types: Cigarettes   Smokeless tobacco: Never  Vaping Use   Vaping Use: Never used  Substance and Sexual Activity   Alcohol  use: Not Currently    Comment: rarely   Drug use: No   Sexual activity: Yes    Birth control/protection: Surgical    Comment: Hysterectomy  Other Topics Concern   Not on file  Social History Narrative   Not on file   Social Determinants of Health   Financial Resource Strain: Not on file  Food Insecurity: Not on file  Transportation Needs: Not on file  Physical Activity: Not on file  Stress: Not on file  Social Connections: Not on file  Intimate Partner Violence: Not on file    Current Outpatient Medications on File Prior to Visit  Medication Sig Dispense Refill   acetaminophen (TYLENOL) 325 MG tablet Take 1-2 tablets (325-650 mg total) by mouth every 6 (six) hours as needed for mild pain (pain score 1-3 or temp > 100.5).     ALPRAZolam (XANAX) 0.25 MG  tablet Take 1 tablet (0.25 mg total) by mouth 2 (two) times daily as needed for anxiety. 60 tablet 0   docusate sodium (COLACE) 100 MG capsule Take 1 capsule (100 mg total) by mouth 2 (two) times daily. 10 capsule 0   gabapentin (NEURONTIN) 300 MG capsule Take 300 mg by mouth at bedtime as needed (pain).     HYDROcodone-acetaminophen (NORCO/VICODIN) 5-325 MG tablet Take 1 tablet by mouth every 8 (eight) hours as needed.     meclizine (ANTIVERT) 12.5 MG tablet Take 1 tablet (12.5 mg total) by mouth 3 (three) times daily as needed for dizziness. 30 tablet 0   simvastatin (ZOCOR) 10 MG tablet Take 1 tablet (10 mg total) by mouth at bedtime. 90 tablet 3   No current facility-administered medications on file prior to visit.      ROS:  Review of Systems  Constitutional:  Negative for fatigue, fever and unexpected weight change.  Respiratory:  Negative for cough, shortness of breath and wheezing.   Cardiovascular:  Negative for chest pain, palpitations and leg swelling.  Gastrointestinal:  Negative for blood in stool, constipation, diarrhea, nausea, rectal pain and vomiting.  Endocrine: Positive for heat intolerance. Negative for cold intolerance and polyuria.  Genitourinary:  Negative for dyspareunia, dysuria, flank pain, frequency, genital sores, hematuria, menstrual problem, pelvic pain, urgency, vaginal bleeding, vaginal discharge and vaginal pain.  Musculoskeletal:  Positive for arthralgias. Negative for back pain, joint swelling and myalgias.  Skin:  Negative for rash.  Neurological:  Positive for numbness and headaches. Negative for dizziness, syncope and light-headedness.  Hematological:  Negative for adenopathy.  Psychiatric/Behavioral:  Positive for agitation and dysphoric mood. Negative for confusion, sleep disturbance and suicidal ideas. The patient is not nervous/anxious.     Objective: There were no vitals taken for this visit.   Physical Exam Constitutional:      Appearance:  She is well-developed.  Genitourinary:     Vulva normal.     Genitourinary Comments: UTERUS/CX SURG REM     Right Labia: No rash, tenderness or lesions.    Left Labia: No tenderness, lesions or rash.    Vaginal cuff intact.    No vaginal discharge, erythema or tenderness.      Right Adnexa: not tender and no mass present.    Left Adnexa: not tender and no mass present.    Cervix is absent.     Uterus is absent.  Breasts:    Right: No mass, nipple discharge, skin change or tenderness.     Left: No mass, nipple discharge, skin change or tenderness.  Neck:  Thyroid: No thyromegaly.  Cardiovascular:     Rate and Rhythm: Normal rate and regular rhythm.     Heart sounds: Normal heart sounds. No murmur heard. Pulmonary:     Effort: Pulmonary effort is normal.     Breath sounds: Normal breath sounds.  Abdominal:     Palpations: Abdomen is soft.     Tenderness: There is no abdominal tenderness. There is no guarding.  Musculoskeletal:        General: Normal range of motion.     Cervical back: Normal range of motion.  Neurological:     General: No focal deficit present.     Mental Status: She is alert and oriented to person, place, and time.     Cranial Nerves: No cranial nerve deficit.  Skin:    General: Skin is warm and dry.  Psychiatric:        Mood and Affect: Mood normal.        Behavior: Behavior normal.        Thought Content: Thought content normal.        Judgment: Judgment normal.  Vitals reviewed.     Assessment/Plan: Encounter for annual routine gynecological examination  Cervical cancer screening - Plan: Cytology - PAP  Vaginal high risk HPV DNA test positive - Plan: Cytology - PAP; repeat pap today.   Encounter for screening mammogram for malignant neoplasm of breast - Plan: MM 3D SCREEN BREAST BILATERAL; pt [facility]  Anxiety - Plan: ALPRAZolam (XANAX) 0.25 MG tablet, Rx RF eRxd. Pt knows to take sparingly, can call for RF prn.   Vasomotor  symptoms due to menopause - Plan: FSH, Estradiol, Progesterone; check labs. Pt interested in HRT prn labs.   Menopause - Plan: FSH, Estradiol, Progesterone   No orders of the defined types were placed in this encounter.            GYN counsel breast self exam, mammography screening, adequate intake of calcium and vitamin D, diet and exercise     F/U  No follow-ups on file.  Kerah Hardebeck B. Clint Strupp, PA-C 09/21/2022 8:00 PM

## 2022-09-22 ENCOUNTER — Ambulatory Visit
Admission: RE | Admit: 2022-09-22 | Discharge: 2022-09-22 | Disposition: A | Discharge status: Home / Self Care | Payer: BC Managed Care – PPO | Source: Ambulatory Visit | Attending: Obstetrics and Gynecology | Admitting: Obstetrics and Gynecology

## 2022-09-22 ENCOUNTER — Ambulatory Visit (INDEPENDENT_AMBULATORY_CARE_PROVIDER_SITE_OTHER): Payer: BC Managed Care – PPO | Admitting: Obstetrics and Gynecology

## 2022-09-22 ENCOUNTER — Encounter: Payer: Self-pay | Admitting: Obstetrics and Gynecology

## 2022-09-22 VITALS — BP 120/80 | Ht 68.0 in | Wt 264.0 lb

## 2022-09-22 DIAGNOSIS — Z1231 Encounter for screening mammogram for malignant neoplasm of breast: Secondary | ICD-10-CM

## 2022-09-22 DIAGNOSIS — F419 Anxiety disorder, unspecified: Secondary | ICD-10-CM

## 2022-09-22 DIAGNOSIS — Z01419 Encounter for gynecological examination (general) (routine) without abnormal findings: Secondary | ICD-10-CM | POA: Diagnosis not present

## 2022-09-22 DIAGNOSIS — N951 Menopausal and female climacteric states: Secondary | ICD-10-CM

## 2022-09-22 MED ORDER — ALPRAZOLAM 0.25 MG PO TABS
0.2500 mg | ORAL_TABLET | Freq: Two times a day (BID) | ORAL | 0 refills | Status: DC | PRN
Start: 2022-09-22 — End: 2023-01-01

## 2022-09-22 NOTE — Patient Instructions (Signed)
I value your feedback and you entrusting us with your care. If you get a [facility] patient survey, I would appreciate you taking the time to let us know about your experience today. Thank you! ? ? ?

## 2022-12-13 ENCOUNTER — Other Ambulatory Visit: Payer: Self-pay | Admitting: Student

## 2022-12-13 DIAGNOSIS — G43809 Other migraine, not intractable, without status migrainosus: Secondary | ICD-10-CM

## 2022-12-14 ENCOUNTER — Ambulatory Visit
Admission: RE | Admit: 2022-12-14 | Discharge: 2022-12-14 | Disposition: A | Discharge status: Home / Self Care | Payer: BC Managed Care – PPO | Source: Ambulatory Visit | Attending: Student | Admitting: Student

## 2022-12-14 DIAGNOSIS — G43809 Other migraine, not intractable, without status migrainosus: Secondary | ICD-10-CM | POA: Insufficient documentation

## 2022-12-30 ENCOUNTER — Telehealth: Payer: Self-pay

## 2022-12-30 NOTE — Telephone Encounter (Signed)
Pt calling triage and would like to ask [facility] if she would be willing to call in a refill of xanax. She has appt to see a PCP in December but will run out before then. She takes it as needed, still has some tablets left but wanted to ask for a refill before she runs out completely.

## 2023-01-01 ENCOUNTER — Other Ambulatory Visit: Payer: Self-pay | Admitting: Obstetrics and Gynecology

## 2023-01-01 DIAGNOSIS — F419 Anxiety disorder, unspecified: Secondary | ICD-10-CM

## 2023-01-01 MED ORDER — ALPRAZOLAM 0.25 MG PO TABS: 0.2500 mg | ORAL_TABLET | Freq: Two times a day (BID) | ORAL | 0 refills | Status: DC | PRN

## 2023-01-01 NOTE — Progress Notes (Signed)
Rx RF xanax till pt sees PCP. Takes sparingly.

## 2023-01-01 NOTE — Telephone Encounter (Signed)
Rx RF eRxd.  

## 2023-01-02 NOTE — Telephone Encounter (Signed)
Called pt, no answer, left detailed msg Rx sent to pharmacy. 

## 2023-01-16 ENCOUNTER — Encounter: Payer: Self-pay | Admitting: *Deleted

## 2023-01-16 ENCOUNTER — Emergency Department: Payer: BC Managed Care – PPO

## 2023-01-16 ENCOUNTER — Other Ambulatory Visit: Payer: Self-pay

## 2023-01-16 ENCOUNTER — Emergency Department
Admission: EM | Admit: 2023-01-16 | Discharge: 2023-01-16 | Disposition: A | Discharge status: Home / Self Care | Payer: BC Managed Care – PPO | Attending: Emergency Medicine | Admitting: Emergency Medicine

## 2023-01-16 DIAGNOSIS — R072 Precordial pain: Secondary | ICD-10-CM | POA: Diagnosis present

## 2023-01-16 DIAGNOSIS — R079 Chest pain, unspecified: Secondary | ICD-10-CM

## 2023-01-16 LAB — BASIC METABOLIC PANEL
Anion gap: 8 (ref 5–15)
BUN: 12 mg/dL (ref 6–20)
CO2: 25 mmol/L (ref 22–32)
Calcium: 9 mg/dL (ref 8.9–10.3)
Chloride: 105 mmol/L (ref 98–111)
Creatinine, Ser: 0.7 mg/dL (ref 0.44–1.00)
GFR, Estimated: >60 mL/min (ref >60)
Glucose, Bld: 124 mg/dL — ABNORMAL HIGH (ref 70–99)
Potassium: 3.7 mmol/L (ref 3.5–5.1)
Sodium: 138 mmol/L (ref 135–145)

## 2023-01-16 LAB — CBC
HCT: 41.8 % (ref 36.0–46.0)
Hemoglobin: 13.3 g/dL (ref 12.0–15.0)
MCH: 26.2 pg (ref 26.0–34.0)
MCHC: 31.8 g/dL (ref 30.0–36.0)
MCV: 82.3 fL (ref 80.0–100.0)
Platelets: 342 10*3/uL (ref 150–400)
RBC: 5.08 MIL/uL (ref 3.87–5.11)
RDW: 14.6 % (ref 11.5–15.5)
WBC: 10.6 10*3/uL — ABNORMAL HIGH (ref 4.0–10.5)
nRBC: 0 % (ref 0.0–0.2)

## 2023-01-16 LAB — TROPONIN I (HIGH SENSITIVITY): Troponin I (High Sensitivity): 3 ng/L (ref <18)

## 2023-01-16 MED ORDER — SUCRALFATE 1 G PO TABS: 1.0000 g | ORAL_TABLET | Freq: Four times a day (QID) | ORAL | 1 refills | Status: DC

## 2023-01-16 NOTE — ED Provider Notes (Signed)
[facility] Provider Note   Event Date/Time   First MD Initiated Contact with Patient 01/16/23 1905     (approximate) History  Chest Pain  HPI Kimberly Nixon is a 57 y.o. female with stated past medical history of GERD and OSA not on a CPAP at night who presents complaining of substernal chest pain that woke her from sleep last night and was associated with burning chest pain throughout the day today.  Patient states that she has never had heartburn that feels like this in the past however this pain is persistent.  Patient states that despite the persistence, it has no exacerbating or relieving factors.  Patient is try to take Tums with no relief. ROS: Patient currently denies any vision changes, tinnitus, difficulty speaking, facial droop, sore throat, chest pain, shortness of breath, abdominal pain, nausea/vomiting/diarrhea, dysuria, or weakness/numbness/paresthesias in any extremity   Physical Exam  Triage Vital Signs: ED Triage Vitals  Encounter Vitals Group     BP 01/16/23 1642 127/81     Systolic BP Percentile --      Diastolic BP Percentile --      Pulse Rate 01/16/23 1642 87     Resp 01/16/23 1642 18     Temp 01/16/23 1642 98 F (36.7 C)     Temp src --      SpO2 01/16/23 1642 98 %     Weight 01/16/23 1640 257 lb (116.6 kg)     Height 01/16/23 1640 5\' 7"  (1.702 m)     Head Circumference --      Peak Flow --      Pain Score 01/16/23 1640 0     Pain Loc --      Pain Education --      Exclude from Growth Chart --    Most recent vital signs: Vitals:   01/16/23 1642  BP: 127/81  Pulse: 87  Resp: 18  Temp: 98 F (36.7 C)  SpO2: 98%   General: Awake, oriented x4. CV:  Good peripheral perfusion. No murmurs gallops or rubs Resp:  Normal effort.   Abd:  No distention.  Other:  Middle-aged obese Caucasian female resting comfortably in no acute distress ED Results / Procedures / Treatments  Labs (all labs ordered are listed, but only abnormal  results are displayed) Labs Reviewed  BASIC METABOLIC PANEL - Abnormal; Notable for the following components:      Result Value   Glucose, Bld 124 (*)    All other components within normal limits  CBC - Abnormal; Notable for the following components:   WBC 10.6 (*)    All other components within normal limits  TROPONIN I (HIGH SENSITIVITY)  TROPONIN I (HIGH SENSITIVITY)   EKG ED ECG REPORT I, Merwyn Katos, the attending physician, personally viewed and interpreted this ECG. Date: 01/16/2023 EKG Time: 1638 Rate: 95 Rhythm: normal sinus rhythm QRS Axis: normal Intervals: normal ST/T Wave abnormalities: normal Narrative Interpretation: no evidence of acute ischemia RADIOLOGY ED MD interpretation: 2 view chest x-ray interpreted by me shows no evidence of acute abnormalities including no pneumonia, pneumothorax, or widened mediastinum -Agree with radiology assessment Official radiology report(s): DG Chest 2 View  Result Date: 01/16/2023 CLINICAL DATA:  Chest pain EXAM: CHEST - 2 VIEW COMPARISON:  09/28/2012 FINDINGS: The heart size and mediastinal contours are within normal limits. Both lungs are clear. The visualized skeletal structures are unremarkable. IMPRESSION: Negative. Electronically Signed   By: Charlett Nose M.D.   On:  01/16/2023 19:07   PROCEDURES: Critical Care performed: No Procedures MEDICATIONS ORDERED IN ED: Medications - No data to display IMPRESSION / MDM / ASSESSMENT AND PLAN / ED COURSE  I reviewed the triage vital signs and the nursing notes.                             The patient is on the cardiac monitor to evaluate for evidence of arrhythmia and/or significant heart rate changes. Patient's presentation is most consistent with acute presentation with potential threat to life or bodily function. This patient presents with atypical chest pain, most likely secondary to musculoskeletal injury. Differential diagnosis includes rib fracture, costochondritis,  sternal fracture. Low suspicion for ACS, acute PE (PERC negative), pericarditis / myocarditis, thoracic aortic dissection, pneumothorax, pneumonia or other acute infectious process. Presentation not consistent with other acute, emergent causes of chest pain at this time. No indication for cardiac enzyme testing. Plan to order CXR to evaluate for acute cardiopulmonary causes.  Plan: EKG, CXR, pain control  Dispo: Discharge home with home care   FINAL CLINICAL IMPRESSION(S) / ED DIAGNOSES   Final diagnoses:  Chest pain, unspecified type   Rx / DC Orders   ED Discharge Orders          Ordered    sucralfate (CARAFATE) 1 g tablet  4 times daily        01/16/23 1951           Note:  This document was prepared using Dragon voice recognition software and may include unintentional dictation errors.   Merwyn Katos, MD 01/16/23 2203

## 2023-01-16 NOTE — ED Triage Notes (Signed)
Pt ambulatory to triage.  Pt reports intermittent sharp pains to center of chest sx began yesterday.  Pt also has intermittent sob. No n/v/d  pt alert  speech clear.

## 2023-04-08 ENCOUNTER — Ambulatory Visit: Payer: Self-pay

## 2023-06-28 ENCOUNTER — Ambulatory Visit

## 2023-07-03 ENCOUNTER — Telehealth: Payer: Self-pay

## 2023-07-03 NOTE — Telephone Encounter (Signed)
 TRIAGE VOICEMAIL: Patient requesting refill of ALPRAZolam  (XANAX ) 0.25 MG tablet .

## 2023-07-03 NOTE — Telephone Encounter (Signed)
 Patient advised Kimberly Nixon is out of the office until Thursday.

## 2023-07-05 ENCOUNTER — Other Ambulatory Visit: Payer: Self-pay | Admitting: Obstetrics and Gynecology

## 2023-07-05 DIAGNOSIS — F419 Anxiety disorder, unspecified: Secondary | ICD-10-CM

## 2023-07-05 MED ORDER — ALPRAZOLAM 0.25 MG PO TABS: 0.2500 mg | ORAL_TABLET | Freq: Two times a day (BID) | ORAL | 0 refills | Status: DC | PRN

## 2023-07-05 NOTE — Telephone Encounter (Signed)
Pt aware.

## 2023-07-05 NOTE — Telephone Encounter (Signed)
Rx RF eRxd.  

## 2023-08-22 ENCOUNTER — Other Ambulatory Visit: Payer: Self-pay | Admitting: Obstetrics and Gynecology

## 2023-08-22 DIAGNOSIS — Z1231 Encounter for screening mammogram for malignant neoplasm of breast: Secondary | ICD-10-CM

## 2023-09-07 ENCOUNTER — Other Ambulatory Visit: Payer: Self-pay | Admitting: Family Medicine

## 2023-09-07 ENCOUNTER — Inpatient Hospital Stay
Admission: RE | Admit: 2023-09-07 | Discharge: 2023-09-07 | Disposition: A | Discharge status: Home / Self Care | Payer: Self-pay | Source: Ambulatory Visit | Attending: Orthopedic Surgery | Admitting: Orthopedic Surgery

## 2023-09-07 DIAGNOSIS — Z049 Encounter for examination and observation for unspecified reason: Secondary | ICD-10-CM

## 2023-09-07 IMAGING — CT CT TEMPORAL BONES W/O CM
2 of 4 series · 12 of 40 positions shown, 15 images · non-contrast
Comparison: Head CT 04/16/2020

CLINICAL DATA: Vertigo, peripheral.  Ear pain and facial numbness.

EXAM:
CT TEMPORAL BONES WITHOUT CONTRAST
TECHNIQUE: Axial and coronal plane CT imaging of the petrous temporal bones was
performed with thin-collimation image reconstruction. No intravenous
contrast was administered. Multiplanar CT image reconstructions were
also generated.

[Series 7: ax mag left · axial · 0.20mm/px · z∈[-76,-31]mm · 9 of 132 slices shown, 12 images]
[im 10/132  brain]
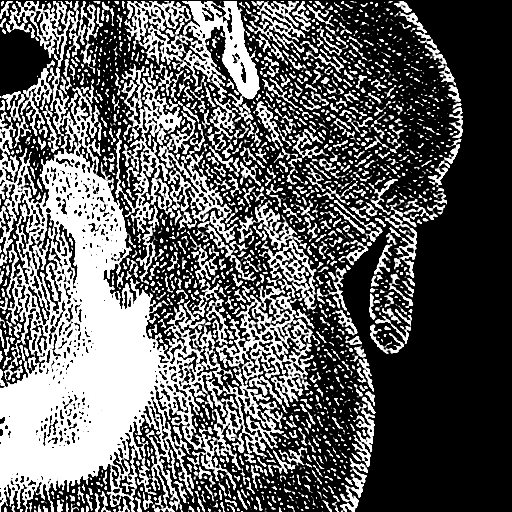
[im 10/132  bone]
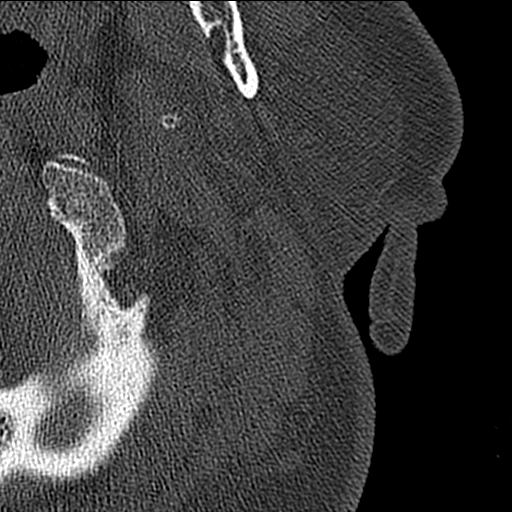
[im 29/132  bone]
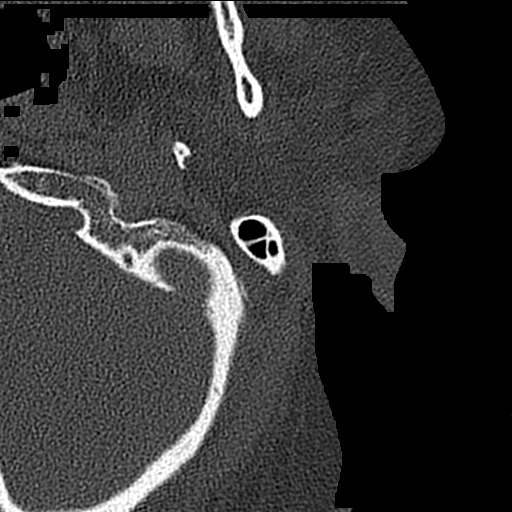
[im 38/132  bone]
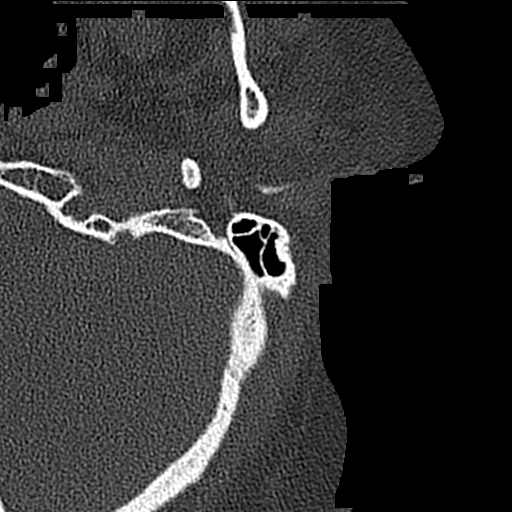
[im 57/132  bone]
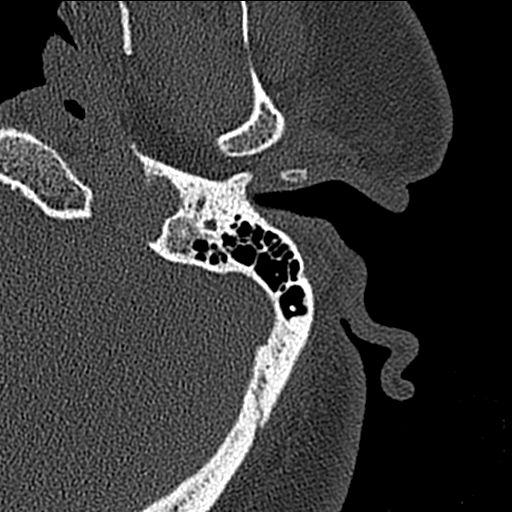
[im 66/132  brain]
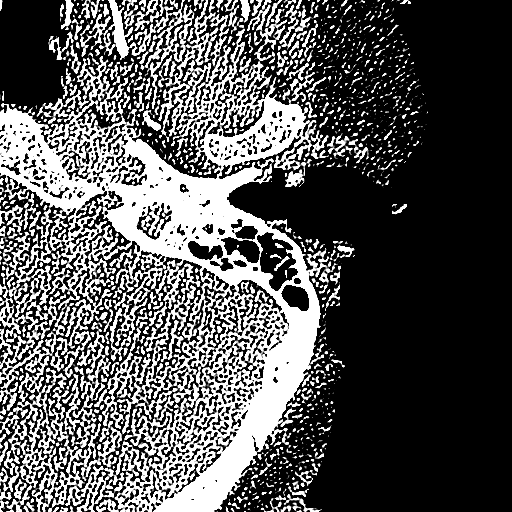
[im 66/132  bone]
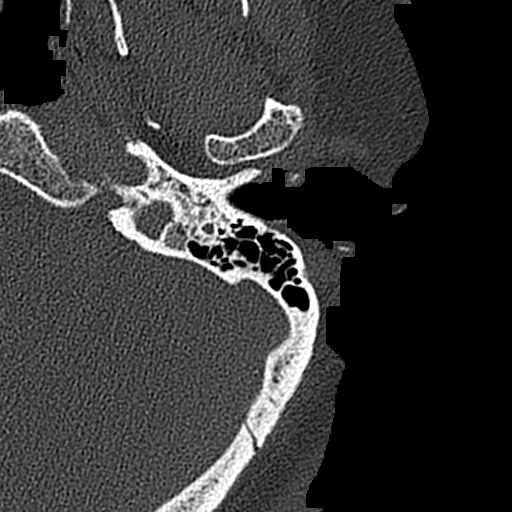
[im 75/132  bone]
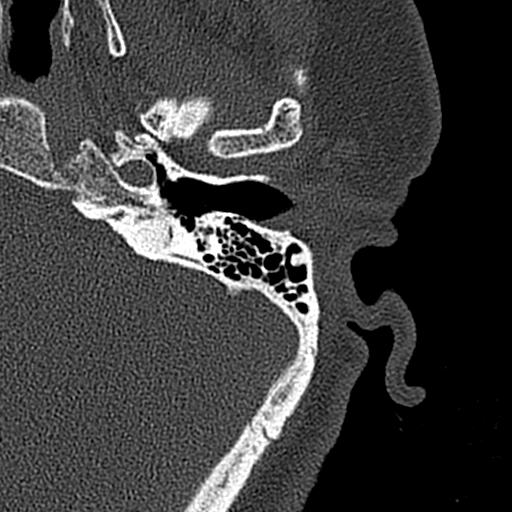
[im 94/132  bone]
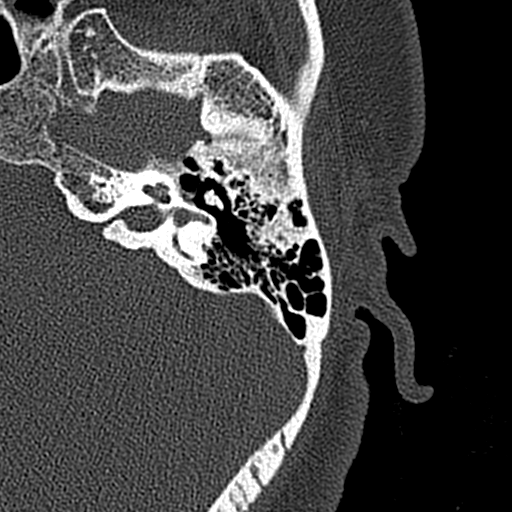
[im 103/132  bone]
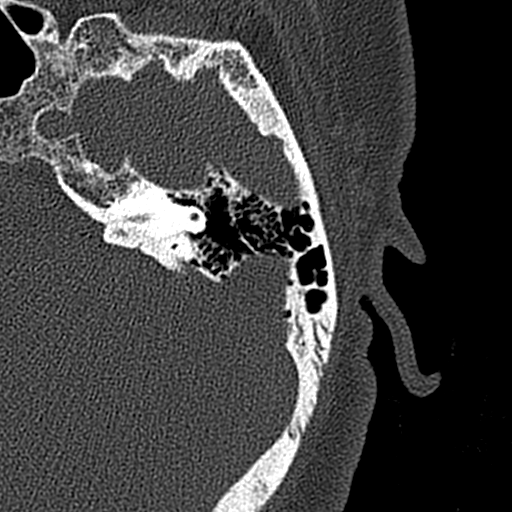
[im 122/132  brain]
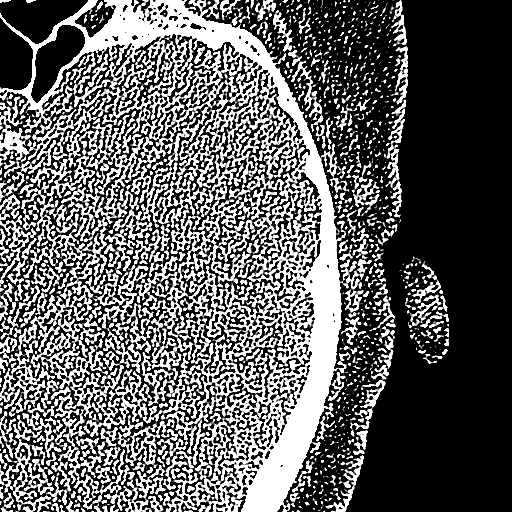
[im 122/132  bone]
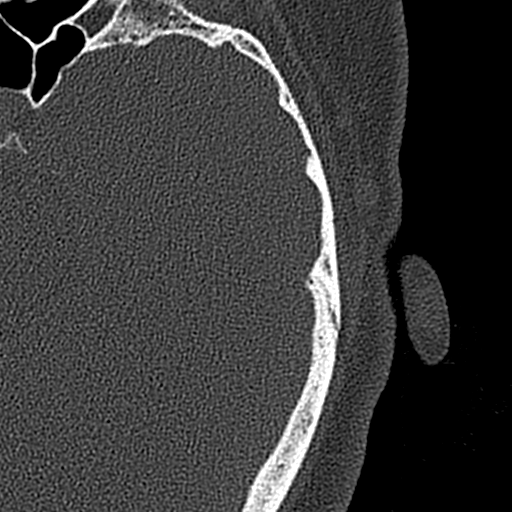

[Series 9: coronal soft tissue · coronal · 0.18mm/px · 3 of 49 slices shown]
[im 17/49  bone]
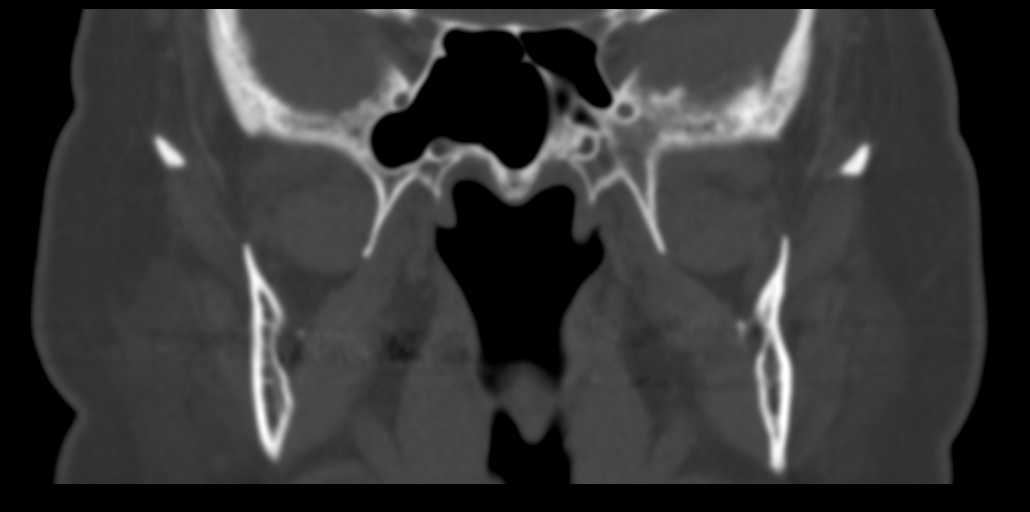
[im 22/49  bone]
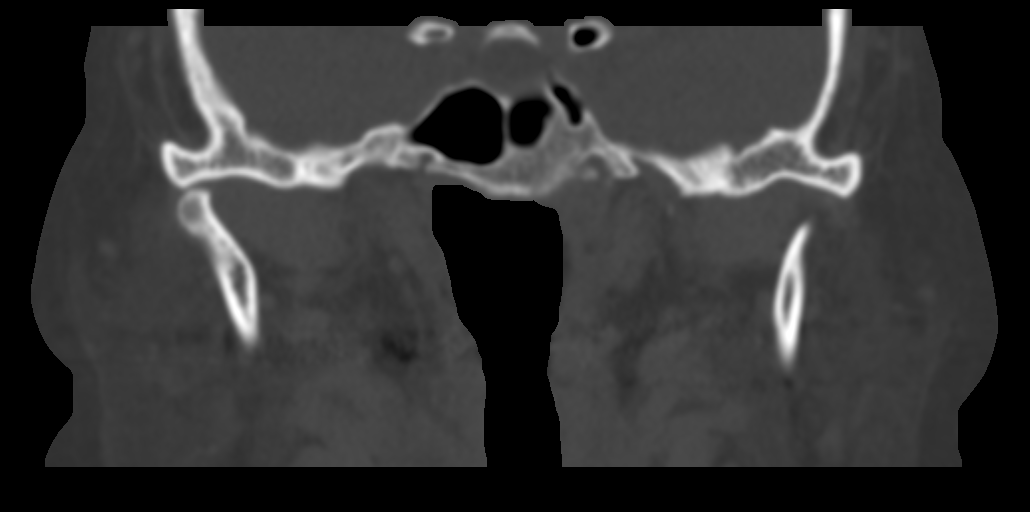
[im 27/49  bone]
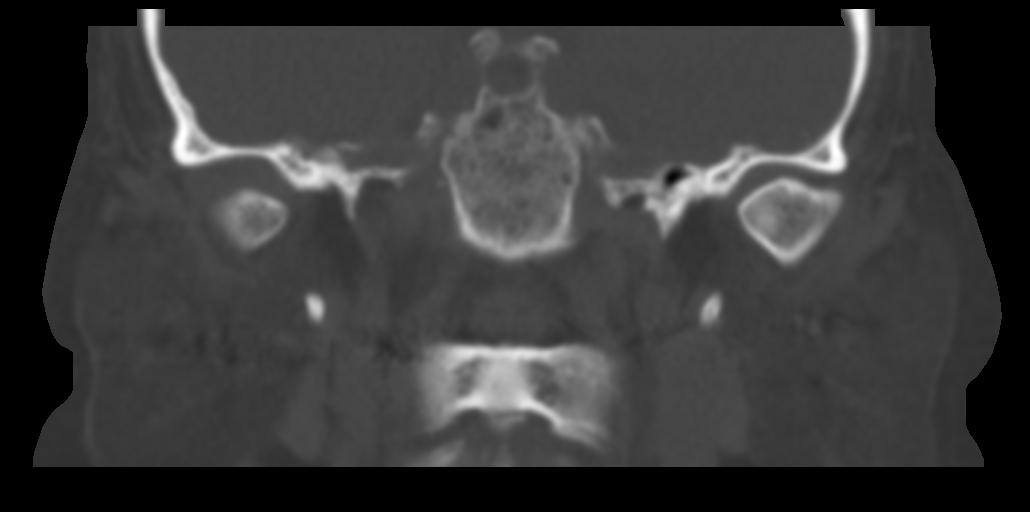

[12 of 40 positions shown; findings below may reference images not displayed]

FINDINGS: RIGHT TEMPORAL BONE

External auditory canal: Abnormal soft tissue in the medial and
superior aspects of the EAC inseparable from the tympanic membrane.

Middle ear cavity: Small to moderate volume soft tissue in the
epitympanum and mesotympanum including in Prussak's space with
blunting of the scutum (series 6, image 90). Soft tissue partially
surrounds the ossicles without definite erosion of the malleus or
incus. The stapes is poorly visualized due to surrounding soft
tissue. The tegmen tympani is grossly intact.

Inner ear structures: The cochlea, vestibule and semicircular canals
are normal. The vestibular aqueduct is not enlarged.

Internal auditory and facial nerve canals: Hyperostosis narrows the
porus acusticus. Thin or absent bone overlying the tympanic segment
of the facial nerve.

Mastoid air cells: Moderate mastoid air cell opacification without
coalescence.

LEFT TEMPORAL BONE

External auditory canal: Normal.

Middle ear cavity: Normally aerated. The scutum and ossicles are
normal. The tegmen tympani is intact.

Inner ear structures: The cochlea, vestibule and semicircular canals
are normal. The vestibular aqueduct is not enlarged.

Internal auditory and facial nerve canals: Hyperostosis narrows the
porus acusticus. Thin or absent bone overlying the tympanic segment
of the facial nerve.

Mastoid air cells: Clear.

Vascular: Normal non-contrast appearance of the carotid canals,
jugular bulbs and sigmoid plates.

Limited intracranial:  Unremarkable.

Visible orbits/paranasal sinuses: Unremarkable included orbits.
Small mucous retention cysts in the right greater than left
maxillary sinuses. Thin or dehiscent bone along the inferior aspect
of the posterior wall of the left maxillary sinus.

Soft tissues: Unremarkable.
IMPRESSION: 1. Abnormal soft tissue in the right middle ear cavity and along the
tympanic membrane in the EAC with early erosion of the scutum
suspicious for cholesteatoma.
2. Moderate right mastoid effusion.

## 2023-09-07 IMAGING — MR MR HEAD W/O CM
13 series · 48 of 48 positions shown · non-contrast
Comparison: Prior temporal bone CT performed earlier on the same
day.

CLINICAL DATA: Initial evaluation for dizziness, right-sided facial
numbness.

EXAM:
MRI HEAD WITHOUT CONTRAST
TECHNIQUE: Multiplanar, multiecho pulse sequences of the brain and surrounding
structures were obtained without intravenous contrast.

[Series 5: ax dwi_tracew · axial · 3.0mm · 0.71mm/px · z∈[-96,+66]mm · 3 of 56 slices shown]
[im 1/56]
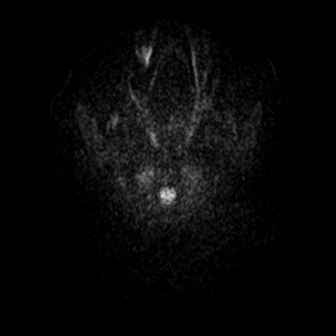
[im 28/56]
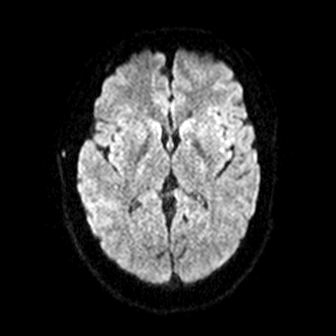
[im 56/56]
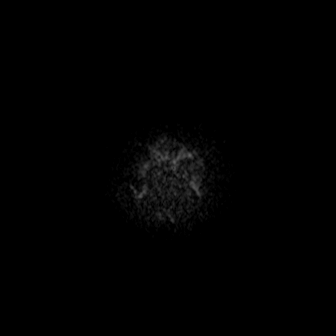

[Series 6: ax dwi_adc · axial · 3.0mm · 0.71mm/px · z∈[-96,+66]mm · 3 of 56 slices shown]
[im 1/56]
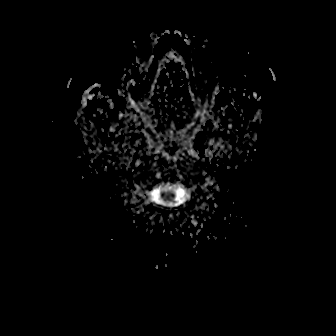
[im 28/56]
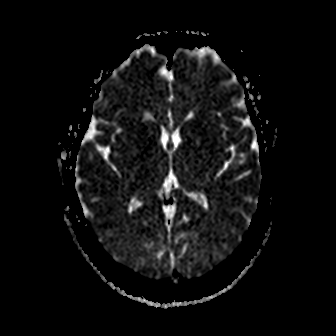
[im 56/56]
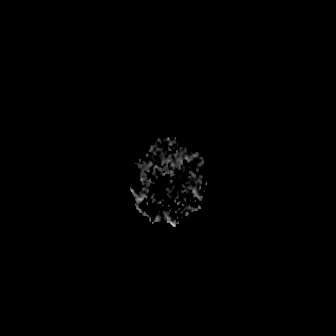

[Series 7: cor dwi_tracew · coronal · 5.0mm · 0.68mm/px · 2 of 40 slices shown]
[im 1/40]
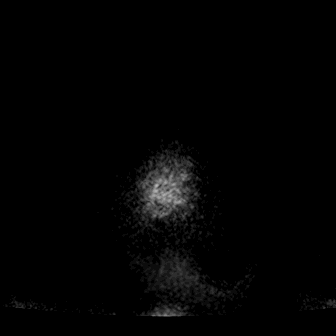
[im 40/40]
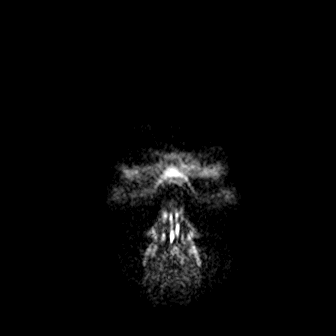

[Series 8: cor dwi_adc · coronal · 5.0mm · 0.68mm/px · 3 of 40 slices shown]
[im 1/40]
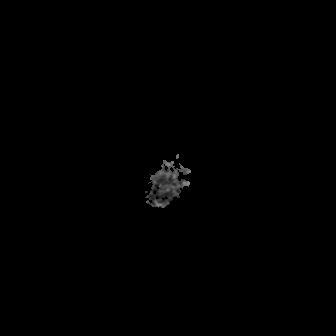
[im 20/40]
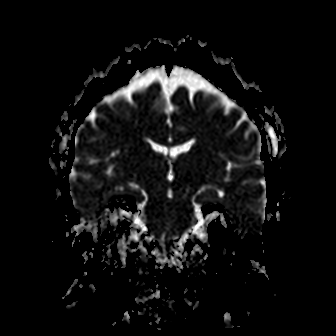
[im 40/40]
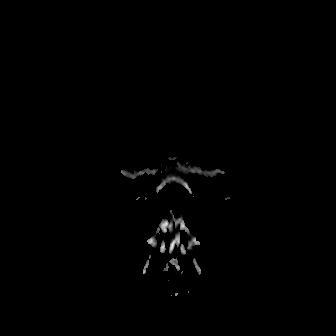

[Series 9: T1 · sagittal · 5.0mm · 0.47mm/px · 2 of 24 slices shown (1 of 2)]
[im 1/24]
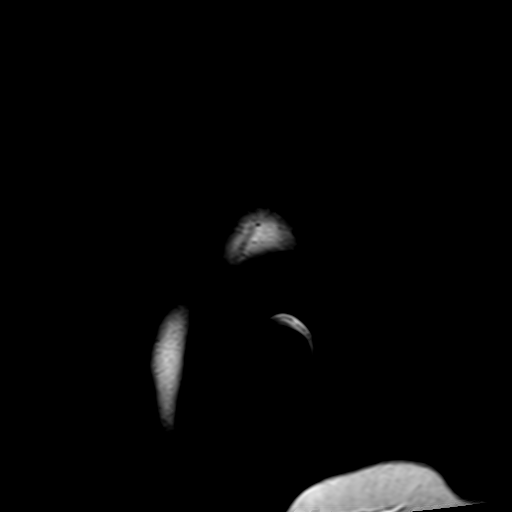
[im 24/24]
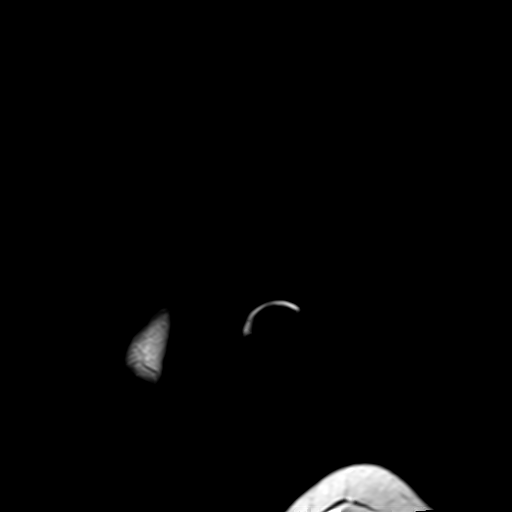

[Series 10: T2 · axial · 5.0mm · 0.86mm/px · z∈[-92,+61]mm · 2 of 27 slices shown (1 of 2)]
[im 1/27]
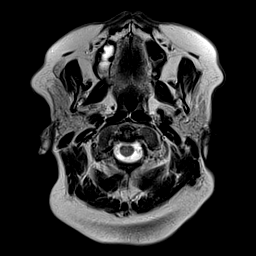
[im 27/27]
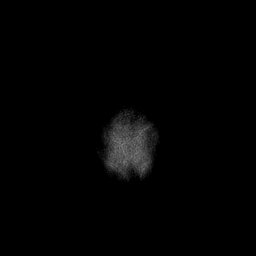

[Series 11: mag_images · axial · 3.0mm · 0.90mm/px · z∈[-90,+61]mm · 4 of 52 slices shown]
[im 1/52]
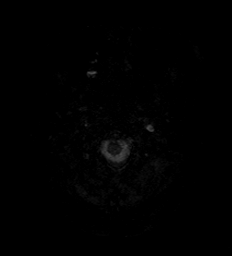
[im 18/52]
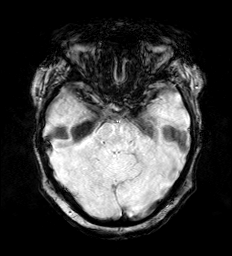
[im 35/52]
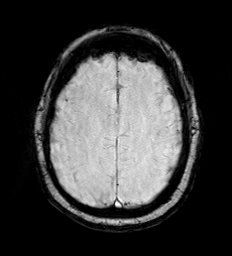
[im 52/52]
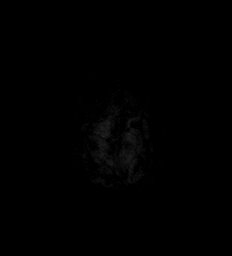

[Series 12: pha_images · axial · 3.0mm · 0.90mm/px · z∈[-90,+61]mm · 4 of 52 slices shown]
[im 1/52]
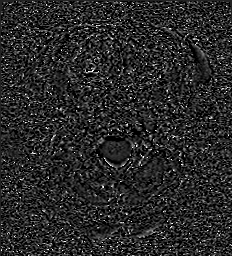
[im 18/52]
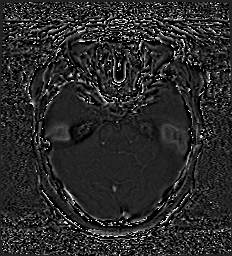
[im 35/52]
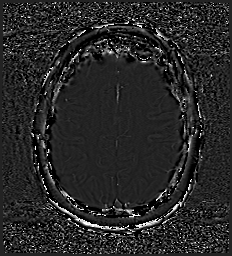
[im 52/52]
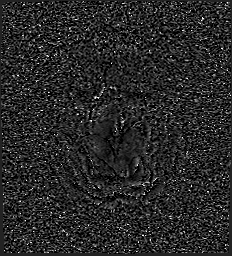

[Series 13: swi_images · axial · 3.0mm · 0.90mm/px · z∈[-90,+61]mm · 4 of 52 slices shown]
[im 1/52]
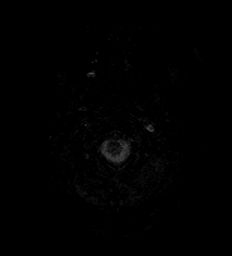
[im 18/52]
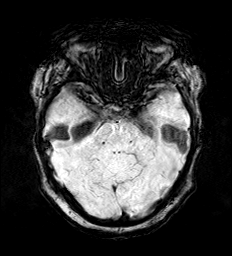
[im 35/52]
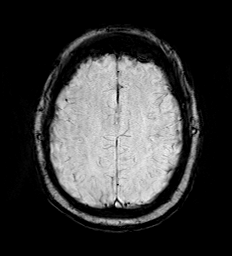
[im 52/52]
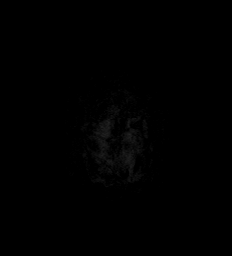

[Series 14: mip_images(sw) · axial · 24.0mm · 0.90mm/px · z∈[-79,+51]mm · 3 of 45 slices shown]
[im 1/45]
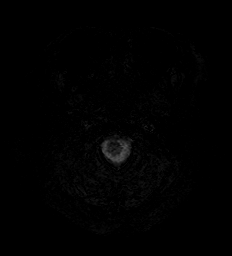
[im 23/45]
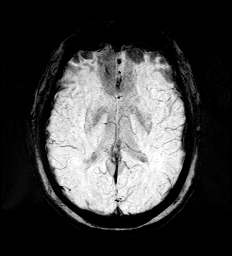
[im 45/45]
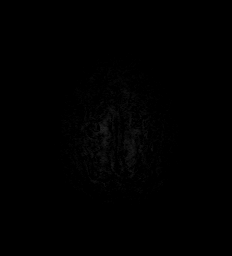

[Series 15: FLAIR · axial · 3.0mm · 0.69mm/px · z∈[-95,+64]mm · 4 of 55 slices shown]
[im 1/55]
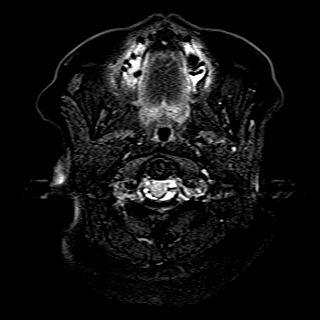
[im 19/55]
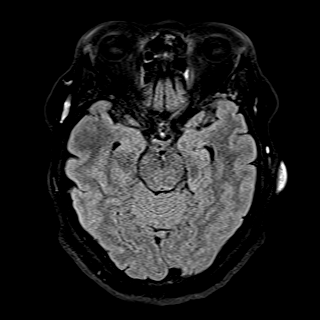
[im 37/55]
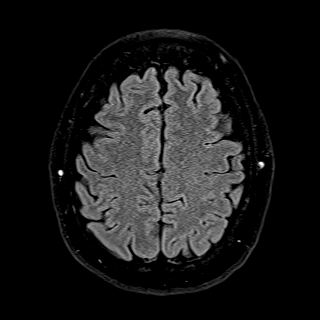
[im 55/55]
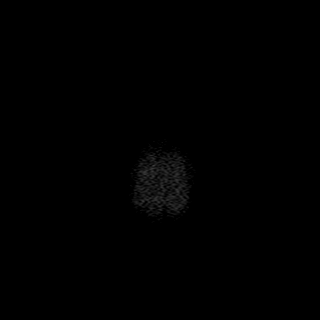

[Series 16: T1 · axial · 1.0mm · 0.98mm/px · z∈[-105,+68]mm · 12 of 176 slices shown (2 of 2)]
[im 1/176]
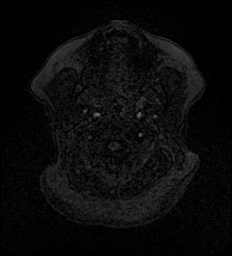
[im 16/176]
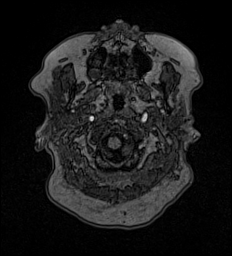
[im 32/176]
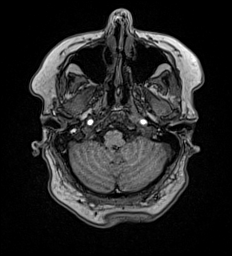
[im 48/176]
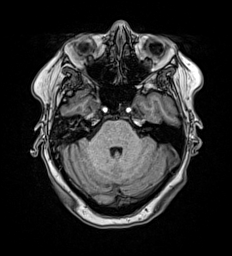
[im 64/176]
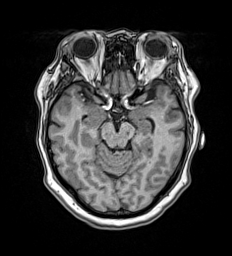
[im 80/176]
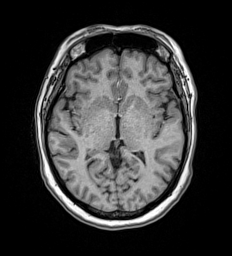
[im 96/176]
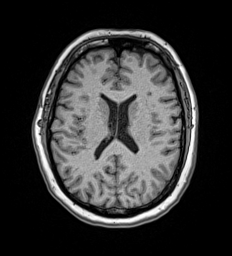
[im 112/176]
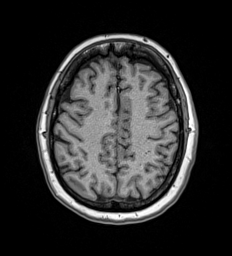
[im 128/176]
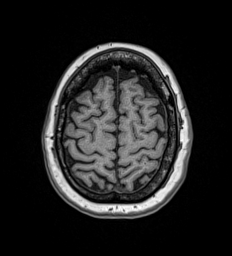
[im 144/176]
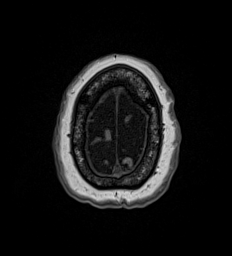
[im 160/176]
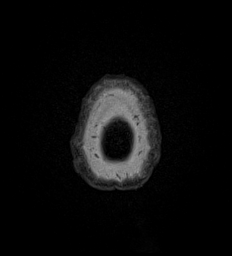
[im 176/176]
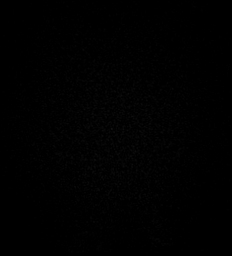

[Series 17: T2 · coronal · 5.0mm · 0.86mm/px · 2 of 30 slices shown (2 of 2)]
[im 1/30]
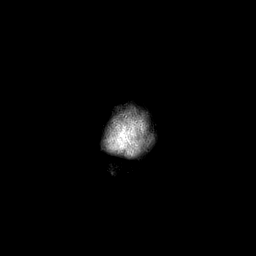
[im 30/30]
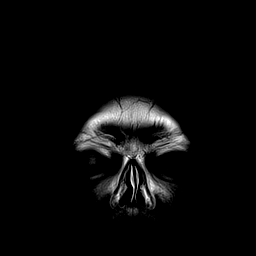

[48 of 48 positions shown; findings below may reference images not displayed]

FINDINGS: Brain: Cerebral volume within normal limits. No focal parenchymal
signal abnormality or significant cerebral white matter disease.

No abnormal foci of restricted diffusion to suggest acute or
subacute ischemia. Gray-white matter differentiation maintained. No
encephalomalacia to suggest chronic cortical infarction. No evidence
for acute or chronic intracranial hemorrhage.

No mass lesion, midline shift or mass effect. No hydrocephalus or
extra-axial fluid collection. Pituitary gland suprasellar region
within normal limits. Midline structures intact and normal.

Vascular: Major intracranial vascular flow voids are maintained.

Skull and upper cervical spine: Craniocervical junction within
normal limits. Bone marrow signal intensity normal. No scalp soft
tissue abnormality.

Sinuses/Orbits: Globes and orbital soft tissues are within normal
limits. Few small maxillary sinus retention cyst noted. Paranasal
sinuses are otherwise clear. Right mastoid and middle ear effusion,
better evaluated on prior temporal bone CT.

Other: None.
IMPRESSION: 1. Normal brain MRI. No acute intracranial abnormality identified.
2. Right mastoid and middle ear density/effusion, better evaluated
on prior temporal bone CT.

## 2023-09-12 ENCOUNTER — Other Ambulatory Visit: Payer: Self-pay | Admitting: Obstetrics and Gynecology

## 2023-09-12 ENCOUNTER — Telehealth: Payer: Self-pay

## 2023-09-12 DIAGNOSIS — F419 Anxiety disorder, unspecified: Secondary | ICD-10-CM

## 2023-09-12 MED ORDER — ALPRAZOLAM 0.25 MG PO TABS: 0.2500 mg | ORAL_TABLET | Freq: Two times a day (BID) | ORAL | 0 refills | Status: DC | PRN

## 2023-09-12 NOTE — Telephone Encounter (Signed)
Pt aware.

## 2023-09-12 NOTE — Telephone Encounter (Signed)
 Pt calling office requesting RF of ALPRAZolam  (XANAX ) 0.25 MG tablet . Annual scheduled on 09/28/23.

## 2023-09-12 NOTE — Telephone Encounter (Signed)
Rx RF eRxd.  

## 2023-09-12 NOTE — Progress Notes (Signed)
Rx RF xanax to take sparingly 

## 2023-09-18 NOTE — Progress Notes (Deleted)
 Referring Physician:  Donal Channing SQUIBB, FNP [address] [place],  [place] [address]  Primary Physician:  Donal Channing SQUIBB, FNP  History of Present Illness: 09/18/2023*** [name] has a history of anxiety, HTN, obesity, OSA, history of stroke, hyperlipidemia.   Has been seeing Dr. Maree for neck pain. Got worse after occipital nerve block in March. Has been on multiple migraine mediations. No relief with baclofen or robaxin . Has not done PT. MRI discussed and she declined.      Neck pain that goes into the back of the head?  Arm pain?   Duration: *** Location: *** Quality: *** Severity: ***  Precipitating: aggravated by *** Modifying factors: made better by *** Weakness: none Timing: *** Bowel/Bladder Dysfunction: none  Conservative measures:  Physical therapy: *** has not participated in?? Multimodal medical therapy including regular antiinflammatories: *** tylenol , ibuprofen, baclofen, robasin Injections: ***  05/24/23: occipital nerve block by Dr. Maree  Past Surgery: ***no spinal surgeries  Kimberly Nixon has ***no symptoms of cervical myelopathy.  The symptoms are causing a significant impact on the patient's life.   Review of Systems:  A 10 point review of systems is negative, except for the pertinent positives and negatives detailed in the HPI.  Past Medical History: Past Medical History:  Diagnosis Date   Anemia    Anxiety    Cervical high risk human papillomavirus (HPV) DNA test positive    Depression    Family history of colon cancer in father    Headache    PONV (postoperative nausea and vomiting)     Past Surgical History: Past Surgical History:  Procedure Laterality Date   ABDOMINAL HYSTERECTOMY  2003   CESAREAN SECTION  1997   CHOLECYSTECTOMY  2011   COLONOSCOPY  2016, 2021   JOINT REPLACEMENT     TOTAL HIP ARTHROPLASTY Left 12/18/2014   Procedure: TOTAL HIP ARTHROPLASTY ANTERIOR APPROACH;  Surgeon: Ozell Flake, MD;   Location: [facility];  Service: Orthopedics;  Laterality: Left;   TOTAL HIP ARTHROPLASTY Right 08/20/2020   Procedure: TOTAL HIP ARTHROPLASTY ANTERIOR APPROACH;  Surgeon: Flake Ozell, MD;  Location: [facility];  Service: Orthopedics;  Laterality: Right;    Allergies: Allergies as of 09/19/2023 - Review Complete 01/16/2023  Allergen Reaction Noted   Ciprofloxacin   01/27/2022   Rosuvastatin   01/27/2022    Medications: Outpatient Encounter Medications as of 09/19/2023  Medication Sig   acetaminophen  (TYLENOL ) 325 MG tablet Take 1-2 tablets (325-650 mg total) by mouth every 6 (six) hours as needed for mild pain (pain score 1-3 or temp > 100.5).   ALPRAZolam  (XANAX ) 0.25 MG tablet Take 1 tablet (0.25 mg total) by mouth 2 (two) times daily as needed for anxiety.   Galcanezumab-gnlm 120 MG/ML SOAJ Inject into the skin.   sucralfate  (CARAFATE ) 1 g tablet Take 1 tablet (1 g total) by mouth 4 (four) times daily.   traMADol  (ULTRAM ) 50 MG tablet Take 50 mg by mouth 3 (three) times daily.   No facility-administered encounter medications on file as of 09/19/2023.    Social History: Social History   Tobacco Use   Smoking status: Every Day    Current packs/day: 0.25    Types: Cigarettes   Smokeless tobacco: Never  Vaping Use   Vaping status: Never Used  Substance Use Topics   Alcohol use: Not Currently    Comment: rarely   Drug use: No    Family Medical History: Family History  Problem Relation Age of Onset   Colon  cancer Father 30   Breast cancer Maternal Grandmother 91   Ovarian cancer Neg Hx     Physical Examination: There were no vitals filed for this visit.  General: Patient is well developed, well nourished, calm, collected, and in no apparent distress. Attention to examination is appropriate.  Respiratory: Patient is breathing without any difficulty.   NEUROLOGICAL:     Awake, alert, oriented to person, place, and time.  Speech is clear and fluent. Fund of knowledge is  appropriate.   Cranial Nerves: Pupils equal round and reactive to light.  Facial tone is symmetric.    *** ROM of cervical spine *** pain *** posterior cervical tenderness. *** tenderness in bilateral trapezial region.   *** ROM of lumbar spine *** pain *** posterior lumbar tenderness.   No abnormal lesions on exposed skin.   Strength: Side Biceps Triceps Deltoid Interossei Grip Wrist Ext. Wrist Flex.  R 5 5 5 5 5 5 5   L 5 5 5 5 5 5 5    Side Iliopsoas Quads Hamstring PF DF EHL  R 5 5 5 5 5 5   L 5 5 5 5 5 5    Reflexes are ***2+ and symmetric at the biceps, brachioradialis, patella and achilles.   Hoffman's is absent.  Clonus is not present.   Bilateral upper and lower extremity sensation is intact to light touch.     Gait is normal.   ***No difficulty with tandem gait.    Medical Decision Making  Imaging: Cervical xrays dated 07/10/23:  FINDINGS: No acute fracture or dislocation.  No aggressive osseus lesion. C2-6 are well-visualized on the lateral view. There is moderate degenerative disc disease at C6-7 with anterior osteophyte formation. Prevertebral soft tissues unremarkable. Exam End: 07/10/23 16:37 Last Resulted: 07/11/23 06:29  Received From: [facility]  Result Received: 08/22/23 10:27   I have personally reviewed the images and agree with the above interpretation.  Assessment and Plan: Ms. Hoelzel is a pleasant 58 y.o. female has ***  Treatment options discussed with patient and following plan made:   - Order for physical therapy for *** spine ***. Patient to call to schedule appointment. *** - Continue current medications including ***. Reviewed dosing and side effects.  - Prescription for ***. Reviewed dosing and side effects. Take with food.  - Prescription for *** to take prn muscle spasms. Reviewed dosing and side effects. Discussed this can cause drowsiness.  - MRI of *** to further evaluate *** radiculopathy. No improvement time or  medications (***).  - Referral to PMR at [facility] to discuss possible *** injections.  - Will schedule phone visit to review MRI results once I get them back.   I spent a total of *** minutes in face-to-face and non-face-to-face activities related to this patient's care today including review of outside records, review of imaging, review of symptoms, physical exam, discussion of differential diagnosis, discussion of treatment options, and documentation.   Thank you for involving me in the care of this patient.   Glade Boys PA-C Dept. of Neurosurgery

## 2023-09-19 ENCOUNTER — Ambulatory Visit: Admitting: Orthopedic Surgery

## 2023-09-27 NOTE — Progress Notes (Unsigned)
 No chief complaint on file.    HPI:      [name] is a 58 y.o. G1P1001 who LMP was No LMP recorded. Patient has had a hysterectomy., presents today for her annual examination. Her menses are absent due to hyst. Dysmenorrhea none. She does not have PMB. Has occas vasomotor sx; not bothersome. Labs last yr not conclusive for menopause. Pt doesn't feel she needs tx.  Did prog supp in past.    Sex activity: currently sex active, contraception--hyst. Has occas vaginal dryness, improved with lubricants.   Last Pap: 03/30/21 and 08/21/19 Results were neg/neg HPV DNA 2021; 08/20/18  Results were: no abnormalities /POS HPV DNA, same as 2019. Had colpo/bx of vagina with neg path, with Dr. Arloa 7/20. 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 Pt had colpo 06/04/15. Hx of STDs: trichomonas, HPV on pap   Last mammogram: 09/20/21  Results were: normal--routine follow-up in 12 months; has appt today There is a FH of breast cancer in her MGM, genetic testing not indicated. There is no FH of ovarian cancer. The patient does not do self-breast exams.  Pt's dad had colon cancer age 12. Pt had colonoscopy 11/21, 2016 and 2021 that were normal. She is due again after 5 yrs due to FH; done at [facility].    Tobacco use: The patient currently smokes 1/3 packs of cigarettes per day now; smoked for many years.  Alcohol use: none No drug use Exercise: min active; being treated for LBP   She does get adequate calcium  and Vitamin D in her diet.  Labs with PCP.  She has a hx of anxiety. She uses xanax  0.25 mg 1-2 tabs sparingly. She needs Rx RF; aware of addictive potential.   Past Medical History:  Diagnosis Date   Anemia    Anxiety    Cervical high risk human papillomavirus (HPV) DNA test positive    Depression    Family history of colon cancer in father    Headache    PONV (postoperative nausea and vomiting)     Past Surgical History:  Procedure Laterality Date   ABDOMINAL HYSTERECTOMY  2003   CESAREAN  SECTION  1997   CHOLECYSTECTOMY  2011   COLONOSCOPY  2016, 2021   JOINT REPLACEMENT     TOTAL HIP ARTHROPLASTY Left 12/18/2014   Procedure: TOTAL HIP ARTHROPLASTY ANTERIOR APPROACH;  Surgeon: Ozell Flake, MD;  Location: [facility];  Service: Orthopedics;  Laterality: Left;   TOTAL HIP ARTHROPLASTY Right 08/20/2020   Procedure: TOTAL HIP ARTHROPLASTY ANTERIOR APPROACH;  Surgeon: Flake Ozell, MD;  Location: [facility];  Service: Orthopedics;  Laterality: Right;    Family History  Problem Relation Age of Onset   Colon cancer Father 14   Breast cancer Maternal Grandmother 17   Ovarian cancer Neg Hx     Social History   Socioeconomic History   Marital status: Divorced    Spouse name: Not on file   Number of children: 1   Years of education: Not on file   Highest education level: Not on file  Occupational History   Not on file  Tobacco Use   Smoking status: Every Day    Current packs/day: 0.25    Types: Cigarettes   Smokeless tobacco: Never  Vaping Use   Vaping status: Never Used  Substance and Sexual Activity   Alcohol use: Not Currently    Comment: rarely   Drug use: No   Sexual activity: Yes  Birth control/protection: Surgical    Comment: Hysterectomy  Other Topics Concern   Not on file  Social History Narrative   Not on file   Social Drivers of Health   Financial Resource Strain: Low Risk  (09/12/2023)   Received from [facility]   Overall Financial Resource Strain (CARDIA)    Difficulty of Paying Living Expenses: Not very hard  Food Insecurity: No Food Insecurity (09/12/2023)   Received from [facility]   Hunger Vital Sign    Within the past 12 months, you worried that your food would run out before you got the money to buy more.: Never true    Within the past 12 months, the food you bought just didn't last and you didn't have money to get more.: Never true  Transportation Needs: No Transportation Needs (09/12/2023)   Received from  [facility] - Transportation    In the past 12 months, has lack of transportation kept you from medical appointments or from getting medications?: No    Lack of Transportation (Non-Medical): No  Physical Activity: Not on file  Stress: Not on file  Social Connections: Not on file  Intimate Partner Violence: Not on file    Current Outpatient Medications on File Prior to Visit  Medication Sig Dispense Refill   acetaminophen  (TYLENOL ) 325 MG tablet Take 1-2 tablets (325-650 mg total) by mouth every 6 (six) hours as needed for mild pain (pain score 1-3 or temp > 100.5).     ALPRAZolam  (XANAX ) 0.25 MG tablet Take 1 tablet (0.25 mg total) by mouth 2 (two) times daily as needed for anxiety. 30 tablet 0   Galcanezumab-gnlm 120 MG/ML SOAJ Inject into the skin.     sucralfate  (CARAFATE ) 1 g tablet Take 1 tablet (1 g total) by mouth 4 (four) times daily. 120 tablet 1   traMADol  (ULTRAM ) 50 MG tablet Take 50 mg by mouth 3 (three) times daily.     No current facility-administered medications on file prior to visit.      ROS:  Review of Systems  Constitutional:  Negative for fatigue, fever and unexpected weight change.  Respiratory:  Negative for cough, shortness of breath and wheezing.   Cardiovascular:  Negative for chest pain, palpitations and leg swelling.  Gastrointestinal:  Negative for blood in stool, constipation, diarrhea, nausea and vomiting.  Endocrine: Negative for cold intolerance, heat intolerance and polyuria.  Genitourinary:  Negative for dyspareunia, dysuria, flank pain, frequency, genital sores, hematuria, menstrual problem, pelvic pain, urgency, vaginal bleeding, vaginal discharge and vaginal pain.  Musculoskeletal:  Negative for back pain, joint swelling and myalgias.  Skin:  Negative for rash.  Neurological:  Negative for dizziness, syncope, light-headedness, numbness and headaches.  Hematological:  Negative for adenopathy.   Psychiatric/Behavioral:  Negative for agitation, confusion, sleep disturbance and suicidal ideas. The patient is not nervous/anxious.     Objective: There were no vitals taken for this visit.   Physical Exam Constitutional:      Appearance: She is well-developed.  Genitourinary:     Vulva normal.     Genitourinary Comments: UTERUS/CX SURG REM     Right Labia: No rash, tenderness or lesions.    Left Labia: No tenderness, lesions or rash.    Vaginal cuff intact.    No vaginal discharge, erythema or tenderness.      Right Adnexa: not tender and no mass present.    Left Adnexa: not tender and no mass present.  Cervix is absent.     Uterus is absent.  Breasts:    Right: No mass, nipple discharge, skin change or tenderness.     Left: No mass, nipple discharge, skin change or tenderness.  Neck:     Thyroid: No thyromegaly.  Cardiovascular:     Rate and Rhythm: Normal rate and regular rhythm.     Heart sounds: Normal heart sounds. No murmur heard. Pulmonary:     Effort: Pulmonary effort is normal.     Breath sounds: Normal breath sounds.  Abdominal:     Palpations: Abdomen is soft.     Tenderness: There is no abdominal tenderness. There is no guarding.  Musculoskeletal:        General: Normal range of motion.     Cervical back: Normal range of motion.  Neurological:     General: No focal deficit present.     Mental Status: She is alert and oriented to person, place, and time.     Cranial Nerves: No cranial nerve deficit.  Skin:    General: Skin is warm and dry.  Psychiatric:        Mood and Affect: Mood normal.        Behavior: Behavior normal.        Thought Content: Thought content normal.        Judgment: Judgment normal.  Vitals reviewed.     Assessment/Plan: Encounter for annual routine gynecological examination  Encounter for screening mammogram for malignant neoplasm of breast; pt to schedule mammo  Anxiety - Plan: ALPRAZolam  (XANAX ) 0.25 MG tablet; Rx  RF. Take sparingly   No orders of the defined types were placed in this encounter.            GYN counsel breast self exam, mammography screening, adequate intake of calcium  and vitamin D, diet and exercise     F/U  No follow-ups on file.  Rozlyn Yerby B. Ariani Seier, PA-C 09/27/2023 1:44 PM

## 2023-09-28 ENCOUNTER — Ambulatory Visit
Admission: RE | Admit: 2023-09-28 | Discharge: 2023-09-28 | Disposition: A | Discharge status: Home / Self Care | Source: Ambulatory Visit | Attending: Obstetrics and Gynecology | Admitting: Obstetrics and Gynecology

## 2023-09-28 ENCOUNTER — Encounter: Payer: Self-pay | Admitting: Obstetrics and Gynecology

## 2023-09-28 ENCOUNTER — Ambulatory Visit: Admitting: Obstetrics and Gynecology

## 2023-09-28 VITALS — BP 122/76 | HR 95 | Ht 68.0 in | Wt 279.0 lb

## 2023-09-28 DIAGNOSIS — F419 Anxiety disorder, unspecified: Secondary | ICD-10-CM | POA: Diagnosis not present

## 2023-09-28 DIAGNOSIS — Z1231 Encounter for screening mammogram for malignant neoplasm of breast: Secondary | ICD-10-CM | POA: Diagnosis present

## 2023-09-28 DIAGNOSIS — Z01419 Encounter for gynecological examination (general) (routine) without abnormal findings: Secondary | ICD-10-CM

## 2023-09-28 MED ORDER — ALPRAZOLAM 0.5 MG PO TABS: 0.5000 mg | ORAL_TABLET | Freq: Two times a day (BID) | ORAL | 1 refills | Status: AC | PRN

## 2023-09-28 NOTE — Patient Instructions (Signed)
 I value your feedback and you entrusting [place] with your care. If you get a [place] patient survey, I would appreciate you taking the time to let us know about your experience today. Thank you! ? ? ?

## 2023-10-03 ENCOUNTER — Ambulatory Visit: Payer: Self-pay | Admitting: Obstetrics and Gynecology

## 2023-11-29 ENCOUNTER — Encounter: Payer: Self-pay | Admitting: Physician Assistant

## 2023-11-29 ENCOUNTER — Ambulatory Visit: Admitting: Physician Assistant

## 2023-11-29 ENCOUNTER — Ambulatory Visit
Admission: RE | Admit: 2023-11-29 | Discharge: 2023-11-29 | Disposition: A | Discharge status: Home / Self Care | Source: Ambulatory Visit | Attending: Physician Assistant | Admitting: Physician Assistant

## 2023-11-29 ENCOUNTER — Ambulatory Visit
Admission: RE | Admit: 2023-11-29 | Discharge: 2023-11-29 | Disposition: A | Discharge status: Home / Self Care | Attending: Physician Assistant | Admitting: Physician Assistant

## 2023-11-29 VITALS — BP 122/86 | Ht 68.0 in | Wt 281.2 lb

## 2023-11-29 DIAGNOSIS — M5030 Other cervical disc degeneration, unspecified cervical region: Secondary | ICD-10-CM

## 2023-11-29 DIAGNOSIS — M542 Cervicalgia: Secondary | ICD-10-CM | POA: Insufficient documentation

## 2023-11-29 MED ORDER — TIZANIDINE HCL 4 MG PO TABS: 2.0000 mg | ORAL_TABLET | Freq: Three times a day (TID) | ORAL | 1 refills | Status: DC | PRN

## 2023-11-29 NOTE — Progress Notes (Signed)
 Referring Physician:  Donal Kimberly SQUIBB, FNP [address] [place],  [place] [address]  Primary Physician:  Donal Kimberly SQUIBB, FNP  History of Present Illness: 11/29/2023 [name] is here today with a chief complaint of pain in the back of her neck and into her shoulder.  She has been taking Xanax  which has helped some.  She also endorses migraines and pain that extends in the back of her neck and shoulder, again this only on the right side.  Numbness and tingling in her hands is rare.  No changes to her gait.  No weakness in her hands that she has noticed.     Weakness: none Bowel/Bladder Dysfunction: none  Conservative measures:     Kimberly Nixon has no symptoms of cervical myelopathy.  The symptoms are causing a significant impact on the patient's life.   Review of Systems:  A 10 point review of systems is negative, except for the pertinent positives and negatives detailed in the HPI.  Past Medical History: Past Medical History:  Diagnosis Date   Anemia    Anxiety    Cervical high risk human papillomavirus (HPV) DNA test positive    Depression    Family history of colon cancer in father    Headache    PONV (postoperative nausea and vomiting)     Past Surgical History: Past Surgical History:  Procedure Laterality Date   ABDOMINAL HYSTERECTOMY  2003   CESAREAN SECTION  1997   CHOLECYSTECTOMY  2011   COLONOSCOPY  2016, 2021   JOINT REPLACEMENT     TOTAL HIP ARTHROPLASTY Left 12/18/2014   Procedure: TOTAL HIP ARTHROPLASTY ANTERIOR APPROACH;  Surgeon: Kimberly Flake, MD;  Location: [facility];  Service: Orthopedics;  Laterality: Left;   TOTAL HIP ARTHROPLASTY Right 08/20/2020   Procedure: TOTAL HIP ARTHROPLASTY ANTERIOR APPROACH;  Surgeon: Nixon Ozell, MD;  Location: [facility];  Service: Orthopedics;  Laterality: Right;    Allergies: Allergies as of 11/29/2023 - Review Complete 11/29/2023  Allergen Reaction Noted   Ciprofloxacin   01/27/2022    Rosuvastatin  Other (See Comments) 01/27/2022    Medications: Outpatient Encounter Medications as of 11/29/2023  Medication Sig   AJOVY 225 MG/1.5ML SOAJ INJECT 225 MG SUBCUTANEOUSLY MONTHLY   ALPRAZolam  (XANAX ) 0.5 MG tablet Take 1 tablet (0.5 mg total) by mouth 2 (two) times daily as needed for anxiety.   calcium  citrate (CALCITRATE - DOSED IN MG ELEMENTAL CALCIUM ) 950 (200 Ca) MG tablet Take 200 mg of elemental calcium  by mouth daily.   fluticasone (FLONASE) 50 MCG/ACT nasal spray Place 2 sprays into the nose.   MULTIPLE VITAMIN PO Take by mouth.   omeprazole (PRILOSEC OTC) 20 MG tablet Take 20 mg by mouth daily.   UBRELVY 100 MG TABS Take by mouth.   [DISCONTINUED] acetaminophen  (TYLENOL ) 325 MG tablet Take 1-2 tablets (325-650 mg total) by mouth every 6 (six) hours as needed for mild pain (pain score 1-3 or temp > 100.5).   [DISCONTINUED] busPIRone (BUSPAR) 5 MG tablet Take 5 mg by mouth 2 (two) times daily.   [DISCONTINUED] traMADol  (ULTRAM ) 50 MG tablet Take 50 mg by mouth 3 (three) times daily.   No facility-administered encounter medications on file as of 11/29/2023.    Social History: Social History   Tobacco Use   Smoking status: Every Day    Current packs/day: 0.25    Types: Cigarettes   Smokeless tobacco: Never  Vaping Use   Vaping status: Never Used  Substance Use Topics  Alcohol use: Yes    Comment: rarely   Drug use: No    Family Medical History: Family History  Problem Relation Age of Onset   Colon cancer Father 40   Breast cancer Maternal Grandmother 76   Ovarian cancer Neg Hx     Physical Examination: @VITALWITHPAIN @  General: Patient is well developed, well nourished, calm, collected, and in no apparent distress. Attention to examination is appropriate.  Psychiatric: Patient is non-anxious.  Head:  Pupils equal, round, and reactive to light.  ENT:  Oral mucosa appears well hydrated.  Neck:   Supple.  Full range of  motion.  Respiratory: Patient is breathing without any difficulty.  Extremities: No edema.  Vascular: Palpable dorsal pedal pulses.  Skin:   On exposed skin, there are no abnormal skin lesions.  NEUROLOGICAL:     Awake, alert, oriented to person, place, and time.  Speech is clear and fluent. Fund of knowledge is appropriate.   Cranial Nerves: Pupils equal round and reactive to light.  Facial tone is symmetric.  Facial sensation is symmetric.  ROM of spine: Some minimal tenderness palpation of cervical paraspinals.  Strength: Side Biceps Triceps Deltoid Interossei Grip Wrist Ext. Wrist Flex.  R 5 4 5 4  4- 4- 5  L 5 5 5 5 5 5 5    Side Iliopsoas Quads Hamstring PF DF EHL  R 5 5 5 5 5 5   L 5 5 5 5 5 5    Reflexes are 2+ and symmetric at the biceps, triceps, brachioradialis. Hoffman's is absent.  Clonus is not present.  Toes are down-going.  Bilateral upper and lower extremity sensation is intact to light touch.    Gait is normal.   No difficulty with tandem gait.   No evidence of dysmetria noted.  Medical Decision Making  Imaging: INDICATION: Occipital Neuralgia  + Cervicalgia  Bilateral occipital neuralgia [M54.81 (ICD-10-CM)]  Cervical myofascial pain syndrome [M79.18 (ICD-10-CM)]   COMPARISON: None   FINDINGS:  No acute fracture or dislocation.   No aggressive osseus lesion.  C2-6 are well-visualized on the lateral view.  There is moderate degenerative disc disease at C6-7 with anterior  osteophyte formation.  Prevertebral soft tissues unremarkable.   I have personally reviewed the images and agree with the above interpretation.  Assessment and Plan: [name] is a pleasant 58 y.o. female with degenerative disc disease of her cervical spine complaining of prolonged pain in the back of her neck into primarily her right shoulder as well as up into the back of her head.  She did have occipital neuralgia injections which did not help her pain.  We did discuss options  going forward.  Plan includes the following:  -Cervical spine x-rays including flexion-extension - MRI of cervical spine.  Will review results once complete.  Would like to evaluate for stenosis. - Referral made for physical therapy - Tizanidine  given for muscle relaxer.  Risks of this medication including drowsiness reviewed at length. - Plan to see back in approximately 8 weeks.    Thank you for involving me in the care of this patient.   I spent a total of 45 minutes in both face-to-face and non-face-to-face activities for this visit on the date of this encounter.   Lyle Decamp, PA-C Dept. of Neurosurgery

## 2023-12-14 NOTE — Addendum Note (Signed)
 Addended by: Jack Mineau W on: 12/14/2023 02:03 PM   Modules accepted: Orders

## 2023-12-28 ENCOUNTER — Ambulatory Visit
Admission: RE | Admit: 2023-12-28 | Discharge: 2023-12-28 | Disposition: A | Discharge status: Home / Self Care | Source: Ambulatory Visit | Attending: Physician Assistant | Admitting: Physician Assistant

## 2023-12-28 ENCOUNTER — Other Ambulatory Visit: Payer: Self-pay | Admitting: Physician Assistant

## 2023-12-28 DIAGNOSIS — M542 Cervicalgia: Secondary | ICD-10-CM | POA: Insufficient documentation

## 2024-01-26 NOTE — Progress Notes (Signed)
 Referring Physician:  Donal Channing SQUIBB, FNP [address] [place],  [place] [address]  Primary Physician:  Donal Channing SQUIBB, FNP   01/31/24 Discussed the use of AI scribe software for clinical note transcription with the patient, who gave verbal consent to proceed.  History of Present Illness Kimberly Nixon is a 58 year old female who originally presents with neck pain and right arm weakness.  Thankfully she has had a significant improvement in her neck pain and arm pain.  She states that she was having some issues but overall has had a significant improvement.  Has not experiencing any numbness at this point only has very mild discomfort and feels like her strength is improved.    History of Present Illness: 11/29/2023 Kimberly Nixon is here today with a chief complaint of pain in the back of her neck and into her shoulder.  She has been taking Xanax  which has helped some.  She also endorses migraines and pain that extends in the back of her neck and shoulder, again this only on the right side.  Numbness and tingling in her hands is rare.  No changes to her gait.  No weakness in her hands that she has noticed.  Weakness: none Bowel/Bladder Dysfunction: none  Conservative measures:   Caran Storck has no symptoms of cervical myelopathy.  The symptoms are causing a significant impact on the patient's life.   Review of Systems:  A 10 point review of systems is negative, except for the pertinent positives and negatives detailed in the HPI.  Past Medical History: Past Medical History:  Diagnosis Date   Anemia    Anxiety    Cervical high risk human papillomavirus (HPV) DNA test positive    Depression    Family history of colon cancer in father    Headache    PONV (postoperative nausea and vomiting)     Past Surgical History: Past Surgical History:  Procedure Laterality Date   ABDOMINAL HYSTERECTOMY  2003   CESAREAN SECTION  1997   CHOLECYSTECTOMY  2011    COLONOSCOPY  2016, 2021   JOINT REPLACEMENT     TOTAL HIP ARTHROPLASTY Left 12/18/2014   Procedure: TOTAL HIP ARTHROPLASTY ANTERIOR APPROACH;  Surgeon: Ozell Flake, MD;  Location: [facility];  Service: Orthopedics;  Laterality: Left;   TOTAL HIP ARTHROPLASTY Right 08/20/2020   Procedure: TOTAL HIP ARTHROPLASTY ANTERIOR APPROACH;  Surgeon: Flake Ozell, MD;  Location: [facility];  Service: Orthopedics;  Laterality: Right;    Allergies: Allergies as of 11/29/2023 - Review Complete 11/29/2023  Allergen Reaction Noted   Ciprofloxacin   01/27/2022   Rosuvastatin  Other (See Comments) 01/27/2022    Medications: Outpatient Encounter Medications as of 11/29/2023  Medication Sig   AJOVY 225 MG/1.5ML SOAJ INJECT 225 MG SUBCUTANEOUSLY MONTHLY   ALPRAZolam  (XANAX ) 0.5 MG tablet Take 1 tablet (0.5 mg total) by mouth 2 (two) times daily as needed for anxiety.   calcium  citrate (CALCITRATE - DOSED IN MG ELEMENTAL CALCIUM ) 950 (200 Ca) MG tablet Take 200 mg of elemental calcium  by mouth daily.   fluticasone (FLONASE) 50 MCG/ACT nasal spray Place 2 sprays into the nose.   MULTIPLE VITAMIN PO Take by mouth.   omeprazole (PRILOSEC OTC) 20 MG tablet Take 20 mg by mouth daily.   UBRELVY 100 MG TABS Take by mouth.   [DISCONTINUED] acetaminophen  (TYLENOL ) 325 MG tablet Take 1-2 tablets (325-650 mg total) by mouth every 6 (six) hours as needed for mild pain (pain score 1-3 or temp >  100.5).   [DISCONTINUED] busPIRone (BUSPAR) 5 MG tablet Take 5 mg by mouth 2 (two) times daily.   [DISCONTINUED] traMADol  (ULTRAM ) 50 MG tablet Take 50 mg by mouth 3 (three) times daily.   No facility-administered encounter medications on file as of 11/29/2023.    Social History: Social History   Tobacco Use   Smoking status: Every Day    Current packs/day: 0.25    Types: Cigarettes   Smokeless tobacco: Never  Vaping Use   Vaping status: Never Used  Substance Use Topics   Alcohol use: Yes    Comment: rarely   Drug use: No     Family Medical History: Family History  Problem Relation Age of Onset   Colon cancer Father 45   Breast cancer Maternal Grandmother 35   Ovarian cancer Neg Hx     Physical Examination:  NEUROLOGICAL:     Awake, alert, oriented to person, place, and time.  Speech is clear and fluent. Fund of knowledge is appropriate.   Cranial Nerves: Pupils equal round and reactive to light.  Facial tone is symmetric.  Facial sensation is symmetric.  ROM of spine: Some minimal tenderness palpation of cervical paraspinals.  Strength: Side Biceps Triceps Deltoid Interossei Grip Wrist Ext. Wrist Flex.  R 5 4 5 5 5 5 5   L 5 5 5 5 5 5 5    Reflexes are 2+ and symmetric at the biceps, triceps, brachioradialis. Hoffman's is absent.  Clonus is not present.  Toes are down-going.  Bilateral upper and lower extremity sensation is intact to light touch.    Gait is normal.   No difficulty with tandem gait.   No evidence of dysmetria noted.  Medical Decision Making  Imaging: INDICATION: Occipital Neuralgia  + Cervicalgia  Bilateral occipital neuralgia [M54.81 (ICD-10-CM)]  Cervical myofascial pain syndrome [M79.18 (ICD-10-CM)]   COMPARISON: None   FINDINGS:  No acute fracture or dislocation.   No aggressive osseus lesion.  C2-6 are well-visualized on the lateral view.  There is moderate degenerative disc disease at C6-7 with anterior  osteophyte formation.  Prevertebral soft tissues unremarkable.    Narrative & Impression  EXAM: MRI CERVICAL SPINE WITHOUT CONTRAST 12/28/2023 05:59:41 PM   TECHNIQUE: Multiplanar multisequence MRI of the cervical spine was performed.   COMPARISON: None available.   CLINICAL HISTORY: Neck pain, chronic, degenerative changes on xray. No surg; No recent injury; c/o neck pain that radiates into RIGHT shoulder x3 weeks   FINDINGS:   BONES AND ALIGNMENT: Normal alignment. Normal vertebral body heights. Bone marrow signal is unremarkable.   SPINAL  CORD: Normal spinal cord size. No abnormal spinal cord signal.   SOFT TISSUES: No paraspinal mass.   C2-C3: No significant disc herniation. No spinal canal stenosis or neural foraminal narrowing.   C3-C4: No significant disc herniation. No spinal canal stenosis or neural foraminal narrowing.   C4-C5: Left paracentral disc protrusion effaces the ventral csf without significant canal or foraminal stenosis.   C5-C6: Posterior disc osteophyte complex with left greater than right facet hypertrophy. Resulting mild to moderate canal stenosis. Moderate to severe left and mild right foraminal stenosis.   C6-C7: Left eccentric posterior disc osteophyte complex with left greater than right facet arthropathy. Resulting moderate left eccentric canal stenosis and severe left foraminal stenosis. Right foramen is patent.   C7-T1: No significant disc herniation. No spinal canal stenosis or neural foraminal narrowing.   IMPRESSION: 1. At C6-C7, severe left foraminal stenosis and moderate left-eccentric canal stenosis. 2. At C5-C6,  moderate to severe left and mild right foraminal stenosis. Mild to moderate canal stenosis.   Electronically signed by: Gilmore Molt MD 01/01/2024 03:36 PM EDT RP Workstation: [id number]   I have personally reviewed the images and agree with the above interpretation.  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 Symptoms have improved with increased strength in the right arm. No current symptoms of spinal cord compression such as balance issues or coordination loss. MRI shows severe left-sided foraminal stenosis at C6-C7 and moderate to severe stenosis on the right at C5-C6 but patient is doing much  better without any active symptoms. Surgical intervention is not recommended due to improved symptoms and absence of significant functional impairment. Discussed potential risks of surgery, including increased risk of injury in case of trauma due to stenosis. Epidural injections may be considered for pain management if symptoms worsen, but are not effective for weakness. - Scheduled follow-up appointment in six months with Brooke to reassess symptoms and condition. - Advised to contact the clinic if symptoms worsen before the scheduled follow-up. - Discussed potential for epidural injections if pain develops, but not for weakness.  Penne MICAEL Sharps, MD Dept. of Neurosurgery

## 2024-01-31 ENCOUNTER — Ambulatory Visit: Admitting: Neurosurgery

## 2024-01-31 ENCOUNTER — Encounter: Payer: Self-pay | Admitting: Neurosurgery

## 2024-01-31 VITALS — BP 132/86 | Ht 68.0 in | Wt 281.0 lb

## 2024-01-31 DIAGNOSIS — M542 Cervicalgia: Secondary | ICD-10-CM | POA: Insufficient documentation

## 2024-01-31 DIAGNOSIS — M50223 Other cervical disc displacement at C6-C7 level: Secondary | ICD-10-CM

## 2024-01-31 DIAGNOSIS — M47812 Spondylosis without myelopathy or radiculopathy, cervical region: Secondary | ICD-10-CM

## 2024-01-31 DIAGNOSIS — M4802 Spinal stenosis, cervical region: Secondary | ICD-10-CM | POA: Insufficient documentation

## 2024-02-05 ENCOUNTER — Other Ambulatory Visit: Payer: Self-pay | Admitting: Family Medicine

## 2024-02-05 DIAGNOSIS — R1031 Right lower quadrant pain: Secondary | ICD-10-CM

## 2024-02-05 DIAGNOSIS — R1011 Right upper quadrant pain: Secondary | ICD-10-CM

## 2024-02-20 ENCOUNTER — Encounter: Payer: Self-pay | Admitting: Family Medicine

## 2024-02-22 ENCOUNTER — Inpatient Hospital Stay: Admission: RE | Admit: 2024-02-22

## 2024-03-04 ENCOUNTER — Inpatient Hospital Stay: Admission: RE | Admit: 2024-03-04 | Discharge: 2024-03-04 | Attending: Family Medicine | Admitting: Family Medicine

## 2024-03-04 ENCOUNTER — Other Ambulatory Visit

## 2024-03-04 DIAGNOSIS — R1031 Right lower quadrant pain: Secondary | ICD-10-CM

## 2024-03-04 DIAGNOSIS — R1011 Right upper quadrant pain: Secondary | ICD-10-CM

## 2024-03-04 MED ORDER — IOPAMIDOL (ISOVUE-300) INJECTION 61%
100.0000 mL | Freq: Once | INTRAVENOUS | Status: AC | PRN
  Administered 2024-03-04: 100 mL via INTRAVENOUS

## 2024-09-02 ENCOUNTER — Ambulatory Visit: Admitting: Physician Assistant

## 2024-09-04 ENCOUNTER — Ambulatory Visit: Admitting: Physician Assistant
